# Patient Record
Sex: Male | Born: 1980
Health system: Southern US, Community
[De-identification: ages and names within clinical notes are randomized; demographics above are authoritative.]

## PROBLEM LIST (undated history)

## (undated) DIAGNOSIS — L02429 Furuncle of limb, unspecified: Secondary | ICD-10-CM

## (undated) DIAGNOSIS — M549 Dorsalgia, unspecified: Secondary | ICD-10-CM

## (undated) DIAGNOSIS — J309 Allergic rhinitis, unspecified: Secondary | ICD-10-CM

## (undated) DIAGNOSIS — L739 Follicular disorder, unspecified: Secondary | ICD-10-CM

## (undated) DIAGNOSIS — E669 Obesity, unspecified: Secondary | ICD-10-CM

## (undated) DIAGNOSIS — G4733 Obstructive sleep apnea (adult) (pediatric): Secondary | ICD-10-CM

## (undated) DIAGNOSIS — Z9989 Dependence on other enabling machines and devices: Principal | ICD-10-CM

## (undated) HISTORY — DX: Allergic rhinitis, unspecified: J30.9

## (undated) HISTORY — DX: Follicular disorder, unspecified: L73.9

## (undated) HISTORY — DX: Dorsalgia, unspecified: M54.9

## (undated) HISTORY — DX: Dependence on other enabling machines and devices: Z99.89

## (undated) HISTORY — PX: NASAL SEPTUM SURGERY: SHX37

## (undated) HISTORY — DX: Obesity, unspecified: E66.9

## (undated) HISTORY — DX: Furuncle of limb, unspecified: L02.429

## (undated) HISTORY — DX: Obstructive sleep apnea (adult) (pediatric): G47.33

---

## 2003-04-28 ENCOUNTER — Ambulatory Visit (HOSPITAL_BASED_OUTPATIENT_CLINIC_OR_DEPARTMENT_OTHER): Admission: RE | Admit: 2003-04-28 | Discharge: 2003-04-28 | Payer: Self-pay | Admitting: Otolaryngology

## 2013-01-08 ENCOUNTER — Encounter: Payer: Self-pay | Admitting: *Deleted

## 2013-01-08 ENCOUNTER — Encounter: Payer: Self-pay | Admitting: Cardiology

## 2013-01-10 ENCOUNTER — Encounter: Payer: Self-pay | Admitting: Cardiology

## 2013-01-10 ENCOUNTER — Ambulatory Visit (INDEPENDENT_AMBULATORY_CARE_PROVIDER_SITE_OTHER): Payer: 59 | Admitting: Cardiology

## 2013-01-10 VITALS — BP 114/70 | HR 72 | Ht 70.0 in | Wt 260.4 lb

## 2013-01-10 DIAGNOSIS — E669 Obesity, unspecified: Secondary | ICD-10-CM | POA: Insufficient documentation

## 2013-01-10 DIAGNOSIS — G4733 Obstructive sleep apnea (adult) (pediatric): Secondary | ICD-10-CM

## 2013-01-10 NOTE — Patient Instructions (Signed)
We [facility] to [facility] your [facility]  Your physician wants you to follow-up in: 6 Months with Dr. Sherlyn Lick will receive a reminder letter in the mail two months in advance. If you don't receive a letter, please call our office to schedule the follow-up appointment.

## 2013-01-10 NOTE — Progress Notes (Signed)
[address] [place], [place]  [address] Phone: [phone] Fax:  [phone]  Date:  01/10/2013   ID:  Justin Hanson, DOB April 27, 1980, MRN [id number]  PCP:  No primary provider on file.  Sleep Medicine:  Armanda Magic, MD  History of Present Illness: Justin Hanson is a 32 y.o. male with a history of obesity who recently saw his PCP for possible sleep apnea.  He was complaining of loud snoring, witnessed apnea, daytime sleepiness and a PSG was ordered.  He was found to have severe OSA with an AHI of 63/hr and underwent CPAP titration to 11cm H2O.  He is tolerating his CPAP well.  He tolerates the mask and pressure well.  He uses a full face mask which he tolerates well.  He feels rested when he gets up and no longer snores at night.  He says that sometimes he forgets to put the mask back on if he gets up to go to the bathroom.  He has had an improvement in his daytime sleepiness.  He does not get any aerobic exercise.  He is sleeping a lot more on his back.   Wt Readings from Last 3 Encounters:  01/10/13 260 lb 6.4 oz (118.117 kg)     Past Medical History  Diagnosis Date  . Allergic rhinitis   . Back pain   . Carbuncle and furuncle of leg, except foot   . Folliculitis     thighs with abscess of the posterior scalp  . OSA on CPAP     severe with AHI 63.66/hr now on CPAP at 11cm H2O  . Obesity (BMI 30-39.9)     Current Outpatient Prescriptions  Medication Sig Dispense Refill  . clomiPHENE (CLOMID) 50 MG tablet Take 50 mg by mouth daily.      Marland Kitchen doxycycline (VIBRAMYCIN) 100 MG capsule Take 100 mg by mouth daily.        No current facility-administered medications for this visit.    Allergies:   No Known Allergies  Social History:  The patient  reports that he has been smoking.  He does not have any smokeless tobacco history on file. He reports that he drinks alcohol.   Family History:  The patient's family history includes Diabetes in his father.   ROS:  Please  see the history of present illness.      All other systems reviewed and negative.   PHYSICAL EXAM: VS:  BP 114/70  Pulse 72  Ht 5\' 10"  (1.778 m)  Wt 260 lb 6.4 oz (118.117 kg)  BMI 37.36 kg/m2 Well nourished, well developed, in no acute distress HEENT: normal Neck: no JVD Cardiac:  normal S1, S2; RRR; no murmur Lungs:  clear to auscultation bilaterally, no wheezing, rhonchi or rales Abd: soft, nontender, no hepatomegaly Ext: no edema Skin: warm and dry Neuro:  CNs 2-12 intact, no focal abnormalities noted       ASSESSMENT AND PLAN:  1. Severe OSA on CPAP therapy with improvement in symptoms of daytime sleepiness and snoring  - His download after 2 week autotitration showed an AHI of 13.3/hr and 67% compliance in using more than 4 hours nightly and 95th % pressure 9.7 and max pressure 10.9cm H2O with an AHI of 13.3.Marland Kitchen    - I have encouraged him to try to sleep on his side and avoid sleeping supine.  - I will set his pressure at 11cm H2O and repeat d/l in 4 weeks with him avoiding supine  sleep. 2. Obesity  - I have encouraged him to get into an exercise program and to work on his diet with low carbs and watch his portions.  Followup with me in 6 months  Signed, Armanda Magic, MD 01/10/2013 4:06 PM

## 2013-02-05 ENCOUNTER — Encounter: Payer: Self-pay | Admitting: Cardiology

## 2013-07-02 ENCOUNTER — Encounter: Payer: Self-pay | Admitting: Cardiology

## 2017-05-16 ENCOUNTER — Other Ambulatory Visit: Payer: Self-pay

## 2017-05-16 ENCOUNTER — Encounter (HOSPITAL_COMMUNITY): Payer: Self-pay | Admitting: Emergency Medicine

## 2017-05-16 ENCOUNTER — Observation Stay (HOSPITAL_COMMUNITY)
Admission: EM | Admit: 2017-05-16 | Discharge: 2017-05-18 | Disposition: A | Discharge status: Home / Self Care | Payer: BLUE CROSS/BLUE SHIELD | Attending: Surgery | Admitting: Surgery

## 2017-05-16 DIAGNOSIS — E669 Obesity, unspecified: Secondary | ICD-10-CM | POA: Diagnosis not present

## 2017-05-16 DIAGNOSIS — K610 Anal abscess: Secondary | ICD-10-CM | POA: Insufficient documentation

## 2017-05-16 DIAGNOSIS — K611 Rectal abscess: Principal | ICD-10-CM | POA: Diagnosis present

## 2017-05-16 DIAGNOSIS — F17200 Nicotine dependence, unspecified, uncomplicated: Secondary | ICD-10-CM | POA: Insufficient documentation

## 2017-05-16 DIAGNOSIS — G4733 Obstructive sleep apnea (adult) (pediatric): Secondary | ICD-10-CM | POA: Diagnosis not present

## 2017-05-16 DIAGNOSIS — Z6837 Body mass index (BMI) 37.0-37.9, adult: Secondary | ICD-10-CM | POA: Insufficient documentation

## 2017-05-16 NOTE — ED Triage Notes (Signed)
Pt to ED with c/o rectal abscess x's 4 days.  Pt st's he went to a Urgent Care and was told to come here.  Pt denies any drainage

## 2017-05-17 ENCOUNTER — Inpatient Hospital Stay (HOSPITAL_COMMUNITY): Payer: BLUE CROSS/BLUE SHIELD | Admitting: Certified Registered Nurse Anesthetist

## 2017-05-17 ENCOUNTER — Encounter (HOSPITAL_COMMUNITY)
Admission: EM | Disposition: A | Discharge status: Home / Self Care | Payer: Self-pay | Source: Home / Self Care | Attending: Emergency Medicine

## 2017-05-17 ENCOUNTER — Encounter (HOSPITAL_COMMUNITY): Payer: Self-pay | Admitting: Radiology

## 2017-05-17 ENCOUNTER — Emergency Department (HOSPITAL_COMMUNITY): Payer: BLUE CROSS/BLUE SHIELD

## 2017-05-17 ENCOUNTER — Other Ambulatory Visit: Payer: Self-pay

## 2017-05-17 DIAGNOSIS — K611 Rectal abscess: Secondary | ICD-10-CM | POA: Diagnosis present

## 2017-05-17 HISTORY — PX: INCISION AND DRAINAGE ABSCESS: SHX5864

## 2017-05-17 HISTORY — PX: INCISE AND DRAIN ABCESS: PRO64

## 2017-05-17 LAB — CBC WITH DIFFERENTIAL/PLATELET
BASOS PCT: 0 %
Basophils Absolute: 0 10*3/uL (ref 0.0–0.1)
EOS PCT: 1 %
Eosinophils Absolute: 0.2 10*3/uL (ref 0.0–0.7)
HEMATOCRIT: 41.8 % (ref 39.0–52.0)
HEMOGLOBIN: 14.4 g/dL (ref 13.0–17.0)
LYMPHS PCT: 18 %
Lymphs Abs: 2.8 10*3/uL (ref 0.7–4.0)
MCH: 32.4 pg (ref 26.0–34.0)
MCHC: 34.4 g/dL (ref 30.0–36.0)
MCV: 93.9 fL (ref 78.0–100.0)
MONOS PCT: 10 %
Monocytes Absolute: 1.5 10*3/uL — ABNORMAL HIGH (ref 0.1–1.0)
NEUTROS PCT: 71 %
Neutro Abs: 10.9 10*3/uL — ABNORMAL HIGH (ref 1.7–7.7)
Platelets: 199 10*3/uL (ref 150–400)
RBC: 4.45 MIL/uL (ref 4.22–5.81)
RDW: 13.9 % (ref 11.5–15.5)
WBC: 15.4 10*3/uL — AB (ref 4.0–10.5)

## 2017-05-17 LAB — CBC
HEMATOCRIT: 40.8 % (ref 39.0–52.0)
HEMOGLOBIN: 13.9 g/dL (ref 13.0–17.0)
MCH: 32.6 pg (ref 26.0–34.0)
MCHC: 34.1 g/dL (ref 30.0–36.0)
MCV: 95.6 fL (ref 78.0–100.0)
Platelets: 182 10*3/uL (ref 150–400)
RBC: 4.27 MIL/uL (ref 4.22–5.81)
RDW: 14 % (ref 11.5–15.5)
WBC: 13.8 10*3/uL — ABNORMAL HIGH (ref 4.0–10.5)

## 2017-05-17 LAB — CREATININE, SERUM
Creatinine, Ser: 0.69 mg/dL (ref 0.61–1.24)
GFR calc Af Amer: >60 mL/min (ref >60)
GFR calc non Af Amer: >60 mL/min (ref >60)

## 2017-05-17 LAB — BASIC METABOLIC PANEL
ANION GAP: 11 (ref 5–15)
BUN: 8 mg/dL (ref 6–20)
CHLORIDE: 101 mmol/L (ref 101–111)
CO2: 23 mmol/L (ref 22–32)
Calcium: 9.3 mg/dL (ref 8.9–10.3)
Creatinine, Ser: 0.69 mg/dL (ref 0.61–1.24)
GFR calc Af Amer: >60 mL/min (ref >60)
GLUCOSE: 124 mg/dL — AB (ref 65–99)
POTASSIUM: 4 mmol/L (ref 3.5–5.1)
Sodium: 135 mmol/L (ref 135–145)

## 2017-05-17 LAB — HIV ANTIBODY (ROUTINE TESTING W REFLEX): HIV Screen 4th Generation wRfx: NONREACTIVE

## 2017-05-17 SURGERY — INCISION AND DRAINAGE, ABSCESS
Anesthesia: General | Site: Rectum

## 2017-05-17 MED ORDER — ENOXAPARIN SODIUM 40 MG/0.4ML ~~LOC~~ SOLN
40.0000 mg | SUBCUTANEOUS | Status: DC
  Filled 2017-05-17: qty 0.4

## 2017-05-17 MED ORDER — HYDRALAZINE HCL 20 MG/ML IJ SOLN: 10.0000 mg | INTRAMUSCULAR | Status: DC | PRN

## 2017-05-17 MED ORDER — IOPAMIDOL (ISOVUE-300) INJECTION 61%
INTRAVENOUS | Status: AC
  Administered 2017-05-17: 100 mL
  Filled 2017-05-17: qty 100

## 2017-05-17 MED ORDER — SUGAMMADEX SODIUM 500 MG/5ML IV SOLN
INTRAVENOUS | Status: DC | PRN
  Administered 2017-05-17: 200 mg via INTRAVENOUS

## 2017-05-17 MED ORDER — KETOROLAC TROMETHAMINE 15 MG/ML IJ SOLN: 15.0000 mg | Freq: Four times a day (QID) | INTRAMUSCULAR | Status: DC | PRN

## 2017-05-17 MED ORDER — ONDANSETRON 4 MG PO TBDP: 4.0000 mg | ORAL_TABLET | Freq: Four times a day (QID) | ORAL | Status: DC | PRN

## 2017-05-17 MED ORDER — FENTANYL CITRATE (PF) 100 MCG/2ML IJ SOLN
INTRAMUSCULAR | Status: DC | PRN
  Administered 2017-05-17: 100 ug via INTRAVENOUS

## 2017-05-17 MED ORDER — ROCURONIUM BROMIDE 100 MG/10ML IV SOLN
INTRAVENOUS | Status: DC | PRN
  Administered 2017-05-17: 50 mg via INTRAVENOUS

## 2017-05-17 MED ORDER — BUPIVACAINE-EPINEPHRINE (PF) 0.5% -1:200000 IJ SOLN
INTRAMUSCULAR | Status: AC
  Filled 2017-05-17: qty 30

## 2017-05-17 MED ORDER — ACETAMINOPHEN 325 MG PO TABS
650.0000 mg | ORAL_TABLET | Freq: Four times a day (QID) | ORAL | Status: DC | PRN
  Administered 2017-05-17 (×2): 650 mg via ORAL
  Filled 2017-05-17 (×2): qty 2

## 2017-05-17 MED ORDER — ONDANSETRON HCL 4 MG/2ML IJ SOLN: 4.0000 mg | Freq: Four times a day (QID) | INTRAMUSCULAR | Status: DC | PRN

## 2017-05-17 MED ORDER — ACETAMINOPHEN 650 MG RE SUPP: 650.0000 mg | Freq: Four times a day (QID) | RECTAL | Status: DC | PRN

## 2017-05-17 MED ORDER — FENTANYL CITRATE (PF) 250 MCG/5ML IJ SOLN
INTRAMUSCULAR | Status: AC
  Filled 2017-05-17: qty 5

## 2017-05-17 MED ORDER — MEPERIDINE HCL 50 MG/ML IJ SOLN: 6.2500 mg | INTRAMUSCULAR | Status: DC | PRN

## 2017-05-17 MED ORDER — OXYCODONE HCL 5 MG PO TABS: 5.0000 mg | ORAL_TABLET | ORAL | Status: DC | PRN

## 2017-05-17 MED ORDER — LIDOCAINE HCL (CARDIAC) 20 MG/ML IV SOLN
INTRAVENOUS | Status: DC | PRN
  Administered 2017-05-17: 60 mg via INTRAVENOUS

## 2017-05-17 MED ORDER — ACETAMINOPHEN 10 MG/ML IV SOLN
INTRAVENOUS | Status: DC | PRN
  Administered 2017-05-17: 1000 mg via INTRAVENOUS

## 2017-05-17 MED ORDER — SULFAMETHOXAZOLE-TRIMETHOPRIM 800-160 MG PO TABS
2.0000 | ORAL_TABLET | Freq: Two times a day (BID) | ORAL | Status: DC
  Administered 2017-05-17 (×2): 2 via ORAL
  Filled 2017-05-17: qty 2

## 2017-05-17 MED ORDER — DIPHENHYDRAMINE HCL 50 MG/ML IJ SOLN: 25.0000 mg | Freq: Four times a day (QID) | INTRAMUSCULAR | Status: DC | PRN

## 2017-05-17 MED ORDER — HYDROMORPHONE HCL 1 MG/ML IJ SOLN: 0.2500 mg | INTRAMUSCULAR | Status: DC | PRN

## 2017-05-17 MED ORDER — LACTATED RINGERS IV SOLN
INTRAVENOUS | Status: DC | PRN
  Administered 2017-05-17: 13:00:00 via INTRAVENOUS

## 2017-05-17 MED ORDER — PROMETHAZINE HCL 25 MG/ML IJ SOLN: 6.2500 mg | INTRAMUSCULAR | Status: DC | PRN

## 2017-05-17 MED ORDER — KCL IN DEXTROSE-NACL 20-5-0.45 MEQ/L-%-% IV SOLN
INTRAVENOUS | Status: DC
  Administered 2017-05-17 (×2): via INTRAVENOUS
  Filled 2017-05-17 (×2): qty 1000

## 2017-05-17 MED ORDER — BUPIVACAINE-EPINEPHRINE (PF) 0.5% -1:200000 IJ SOLN
INTRAMUSCULAR | Status: DC | PRN
  Administered 2017-05-17: 10 mL via PERINEURAL

## 2017-05-17 MED ORDER — DEXAMETHASONE SODIUM PHOSPHATE 10 MG/ML IJ SOLN
INTRAMUSCULAR | Status: DC | PRN
  Administered 2017-05-17: 10 mg via INTRAVENOUS

## 2017-05-17 MED ORDER — MIDAZOLAM HCL 5 MG/5ML IJ SOLN
INTRAMUSCULAR | Status: DC | PRN
  Administered 2017-05-17: 2 mg via INTRAVENOUS

## 2017-05-17 MED ORDER — DIPHENHYDRAMINE HCL 25 MG PO CAPS: 25.0000 mg | ORAL_CAPSULE | Freq: Four times a day (QID) | ORAL | Status: DC | PRN

## 2017-05-17 MED ORDER — CEFAZOLIN SODIUM-DEXTROSE 2-3 GM-%(50ML) IV SOLR
INTRAVENOUS | Status: DC | PRN
  Administered 2017-05-17: 2 g via INTRAVENOUS

## 2017-05-17 MED ORDER — MORPHINE SULFATE (PF) 4 MG/ML IV SOLN: 2.0000 mg | INTRAVENOUS | Status: DC | PRN

## 2017-05-17 MED ORDER — PROPOFOL 10 MG/ML IV BOLUS
INTRAVENOUS | Status: AC
  Filled 2017-05-17: qty 20

## 2017-05-17 MED ORDER — KETOROLAC TROMETHAMINE 30 MG/ML IJ SOLN
30.0000 mg | Freq: Once | INTRAMUSCULAR | Status: AC
  Administered 2017-05-17: 30 mg via INTRAVENOUS
  Filled 2017-05-17: qty 1

## 2017-05-17 MED ORDER — ONDANSETRON HCL 4 MG/2ML IJ SOLN
4.0000 mg | Freq: Once | INTRAMUSCULAR | Status: AC
  Administered 2017-05-17: 4 mg via INTRAVENOUS
  Filled 2017-05-17: qty 2

## 2017-05-17 MED ORDER — ACETAMINOPHEN 10 MG/ML IV SOLN
INTRAVENOUS | Status: AC
  Filled 2017-05-17: qty 100

## 2017-05-17 MED ORDER — MIDAZOLAM HCL 2 MG/2ML IJ SOLN
INTRAMUSCULAR | Status: AC
  Filled 2017-05-17: qty 2

## 2017-05-17 MED ORDER — ONDANSETRON HCL 4 MG/2ML IJ SOLN
INTRAMUSCULAR | Status: DC | PRN
  Administered 2017-05-17: 4 mg via INTRAVENOUS

## 2017-05-17 MED ORDER — OXYCODONE HCL 5 MG/5ML PO SOLN: 5.0000 mg | Freq: Once | ORAL | Status: DC | PRN

## 2017-05-17 MED ORDER — 0.9 % SODIUM CHLORIDE (POUR BTL) OPTIME
TOPICAL | Status: DC | PRN
  Administered 2017-05-17: 500 mL

## 2017-05-17 MED ORDER — MORPHINE SULFATE (PF) 4 MG/ML IV SOLN
4.0000 mg | Freq: Once | INTRAVENOUS | Status: DC
  Filled 2017-05-17: qty 1

## 2017-05-17 MED ORDER — KETOROLAC TROMETHAMINE 30 MG/ML IJ SOLN
INTRAMUSCULAR | Status: DC | PRN
  Administered 2017-05-17: 30 mg via INTRAVENOUS

## 2017-05-17 MED ORDER — PROPOFOL 10 MG/ML IV BOLUS
INTRAVENOUS | Status: DC | PRN
  Administered 2017-05-17: 40 mg via INTRAVENOUS
  Administered 2017-05-17: 160 mg via INTRAVENOUS

## 2017-05-17 MED ORDER — LACTATED RINGERS IV SOLN
INTRAVENOUS | Status: DC
  Administered 2017-05-17: 12:00:00 via INTRAVENOUS

## 2017-05-17 MED ORDER — OXYCODONE HCL 5 MG PO TABS: 5.0000 mg | ORAL_TABLET | Freq: Once | ORAL | Status: DC | PRN

## 2017-05-17 SURGICAL SUPPLY — 35 items
BLADE CLIPPER SURG (BLADE) IMPLANT
BNDG GAUZE ELAST 4 BULKY (GAUZE/BANDAGES/DRESSINGS) IMPLANT
CANISTER SUCT 3000ML PPV (MISCELLANEOUS) ×3 IMPLANT
COVER SURGICAL LIGHT HANDLE (MISCELLANEOUS) ×3 IMPLANT
DRAPE LAPAROSCOPIC ABDOMINAL (DRAPES) IMPLANT
DRAPE LAPAROTOMY 100X72 PEDS (DRAPES) ×3 IMPLANT
DRAPE UTILITY 15X26 W/TAPE STR (DRAPE) ×3 IMPLANT
DRESSING ADAPTIC 1/2  N-ADH (PACKING) ×3 IMPLANT
DRSG PAD ABDOMINAL 8X10 ST (GAUZE/BANDAGES/DRESSINGS) ×3 IMPLANT
ELECT CAUTERY BLADE 6.4 (BLADE) ×3 IMPLANT
ELECT REM PT RETURN 9FT ADLT (ELECTROSURGICAL) ×3
ELECTRODE REM PT RTRN 9FT ADLT (ELECTROSURGICAL) ×1 IMPLANT
GAUZE SPONGE 4X4 12PLY STRL (GAUZE/BANDAGES/DRESSINGS) ×3 IMPLANT
GLOVE BIO SURGEON STRL SZ 6 (GLOVE) ×3 IMPLANT
GLOVE BIOGEL PI IND STRL 6.5 (GLOVE) ×1 IMPLANT
GLOVE BIOGEL PI INDICATOR 6.5 (GLOVE) ×2
GOWN STRL REUS W/ TWL LRG LVL3 (GOWN DISPOSABLE) ×2 IMPLANT
GOWN STRL REUS W/TWL LRG LVL3 (GOWN DISPOSABLE) ×4
KIT BASIN OR (CUSTOM PROCEDURE TRAY) ×3 IMPLANT
KIT ROOM TURNOVER OR (KITS) ×3 IMPLANT
NEEDLE 18GX1X1/2 (RX/OR ONLY) (NEEDLE) ×3 IMPLANT
NEEDLE HYPO 25GX1X1/2 BEV (NEEDLE) ×3 IMPLANT
NS IRRIG 1000ML POUR BTL (IV SOLUTION) ×3 IMPLANT
PACK SURGICAL SETUP 50X90 (CUSTOM PROCEDURE TRAY) ×3 IMPLANT
PAD ARMBOARD 7.5X6 YLW CONV (MISCELLANEOUS) ×3 IMPLANT
PENCIL BUTTON HOLSTER BLD 10FT (ELECTRODE) ×3 IMPLANT
SWAB COLLECTION DEVICE MRSA (MISCELLANEOUS) ×3 IMPLANT
SWAB CULTURE ESWAB REG 1ML (MISCELLANEOUS) ×3 IMPLANT
SYR BULB 3OZ (MISCELLANEOUS) ×3 IMPLANT
SYR CONTROL 10ML LL (SYRINGE) ×6 IMPLANT
TOWEL OR 17X24 6PK STRL BLUE (TOWEL DISPOSABLE) ×3 IMPLANT
TOWEL OR 17X26 10 PK STRL BLUE (TOWEL DISPOSABLE) ×3 IMPLANT
TUBE CONNECTING 12'X1/4 (SUCTIONS) ×1
TUBE CONNECTING 12X1/4 (SUCTIONS) ×2 IMPLANT
YANKAUER SUCT BULB TIP NO VENT (SUCTIONS) ×3 IMPLANT

## 2017-05-17 NOTE — Progress Notes (Signed)
Patient refused CPAP at this time. Wears occasionally at home. Encouraged patient to let RT know if CPAP desired during hospital stay.

## 2017-05-17 NOTE — ED Notes (Signed)
Pt sleeping at this time.  Wife at bedside.  

## 2017-05-17 NOTE — Discharge Instructions (Signed)
Disposable Sitz Bath °A disposable sitz bath is a plastic basin that fits over the toilet. A bag is hung above the toilet, and the bag is connected to a tube that opens into the basin. The bag is filled with warm water that flows into the basin through the tube. A sitz bath can be used to help relieve symptoms, clean, and promote healing in the genital and anal areas, as well as in the lower abdomen and buttocks. °What are the risks? °Sitz baths are generally very safe. It is possible for the skin between the genitals and the anus (perineum) to become infected, but this is rare. You can avoid this by cleaning your sitz bath supplies thoroughly. °How to use a disposable sitz bath °1. Close the clamp on the tube. Make sure the clamp is closed tightly to prevent leakage. °2. Fill the sitz bath basin and the plastic bag with warm water. The water should be warm enough to be comfortable, but not hot. °3. Raise the toilet seat and place the filled basin on the toilet. Make sure the overflow opening is facing toward the back of the toilet. °? If you prefer, you may place the empty basin on the toilet first, and then use the plastic bag to fill the basin with warm water. °4. Hang the filled plastic bag overhead on a hook or towel rack close to the toilet. The bag should be higher than the toilet so that the water will flow down through the tube. °5. Attach the tube to the opening on the basin. Make sure that the tube is attached to the basin tightly to prevent leakage. °6. Sit on the basin and release the clamp. This will allow warm water to flow into the basin and flush the area around your genitals and anus. °7. Remain sitting on the basin for about 15-20 minutes, or as long as told by your health care provider. °8. Stand up and gently pat your skin dry. If directed, apply clean bandages (dressings) to the affected area as told by your health care provider. °9. Carefully remove the basin from the toilet seat and tip the  basin into the toilet to empty any remaining water. Empty any remaining water from the plastic bag into the toilet. Then, flush the toilet. °10. Wash the basin with warm water and soap. Let the basin air dry in the sink. You should also let the plastic bag and the tubing air dry. °11. Store the basin, tubing, and plastic bag in a clean, dry area. °12. Wash your hands with soap and water. If soap and water are not available, use hand sanitizer. °Contact a health care provider if: °· You have symptoms that get worse instead of better. °· You develop new skin irritation, redness, or swelling around your genitals or anus. °This information is not intended to replace advice given to you by your health care provider. Make sure you discuss any questions you have with your health care provider. °Document Released: 09/06/2011 Document Revised: 08/13/2015 Document Reviewed: 01/25/2015 °Elsevier Interactive Patient Education © 2018 [employer] ° °

## 2017-05-17 NOTE — H&P (Signed)
Justin Hanson is an 37 y.o. male.   Chief Complaint: perirectal abscess HPI: 37 yo male with 5 day history of pain and swelling at his anal area. It has gotten more painful each day. He denies drainage. He denies fevers. He had a pilonidal cyst drained 10 years ago.  Past Medical History:  Diagnosis Date  . Allergic rhinitis   . Back pain   . Carbuncle and furuncle of leg, except foot   . Folliculitis    thighs with abscess of the posterior scalp  . Obesity (BMI 30-39.9)   . OSA on CPAP    severe with AHI 63.66/hr now on CPAP at 11cm H2O    Past Surgical History:  Procedure Laterality Date  . NASAL SEPTUM SURGERY      Family History  Problem Relation Age of Onset  . Diabetes Father    Social History:  reports that he has been smoking.  he has never used smokeless tobacco. He reports that he drinks alcohol. He reports that he uses drugs. Drug: Marijuana.  Allergies: No Known Allergies   (Not in a hospital admission)  Results for orders placed or performed during the hospital encounter of 05/16/17 (from the past 48 hour(s))  Basic metabolic panel     Status: Abnormal   Collection Time: 05/17/17 12:13 AM  Result Value Ref Range   Sodium 135 135 - 145 mmol/L   Potassium 4.0 3.5 - 5.1 mmol/L   Chloride 101 101 - 111 mmol/L   CO2 23 22 - 32 mmol/L   Glucose, Bld 124 (H) 65 - 99 mg/dL   BUN 8 6 - 20 mg/dL   Creatinine, Ser 0.69 0.61 - 1.24 mg/dL   Calcium 9.3 8.9 - 10.3 mg/dL   GFR calc non Af Amer >60 >60 mL/min   GFR calc Af Amer >60 >60 mL/min    Comment: (NOTE) The eGFR has been calculated using the CKD EPI equation. This calculation has not been validated in all clinical situations. eGFR's persistently <60 mL/min signify possible Chronic Kidney Disease.    Anion gap 11 5 - 15    Comment: Performed at [facility] [address] [place], [place] [address]   Ct Abdomen Pelvis W Contrast  Result Date: 05/17/2017 CLINICAL DATA:  37 year old male with  perirectal abscess. EXAM: CT ABDOMEN AND PELVIS WITH CONTRAST TECHNIQUE: Multidetector CT imaging of the abdomen and pelvis was performed using the standard protocol following bolus administration of intravenous contrast. CONTRAST:  <See Chart> ISOVUE-300 IOPAMIDOL (ISOVUE-300) INJECTION 61% COMPARISON:  None. FINDINGS: Lower chest: The visualized lung bases are clear. No intra-abdominal free air or free fluid. Hepatobiliary: No focal liver abnormality is seen. No gallstones, gallbladder wall thickening, or biliary dilatation. Pancreas: Unremarkable. No pancreatic ductal dilatation or surrounding inflammatory changes. Spleen: Normal in size without focal abnormality. Adrenals/Urinary Tract: Adrenal glands are unremarkable. Kidneys are normal, without renal calculi, focal lesion, or hydronephrosis. Bladder is unremarkable. Stomach/Bowel: There is a 3.0 x 3.0 x 2.3 cm low attenuating collection posterior to the anus inferior to the levator ani muscle and at the level of the puborectal muscle most consistent with an abscess. There is probably a fistulous track to the rectum (series 3 images 91-93). Evaluation however is limited in the absence of rectal contrast. The remainder of the bowel is unremarkable. There is no bowel obstruction or active inflammation. Normal appendix. Vascular/Lymphatic: No significant vascular findings are present. No enlarged abdominal or pelvic lymph nodes. Reproductive: The prostate and  seminal vesicles are grossly unremarkable. Other: None Musculoskeletal: No acute or significant osseous findings. IMPRESSION: Posterior perianal abscess with probable anal fistula. Clinical correlation is recommended. Electronically Signed   By: Anner Crete M.D.   On: 05/17/2017 01:31    Review of Systems  Constitutional: Negative for chills and fever.  HENT: Negative for hearing loss.   Eyes: Negative for blurred vision and double vision.  Respiratory: Negative for cough and hemoptysis.    Cardiovascular: Negative for chest pain and palpitations.  Gastrointestinal: Negative for abdominal pain, diarrhea, nausea and vomiting.  Genitourinary: Negative for dysuria and urgency.  Musculoskeletal: Negative for myalgias and neck pain.  Skin: Negative for itching and rash.  Neurological: Negative for dizziness, tingling and headaches.  Endo/Heme/Allergies: Does not bruise/bleed easily.  Psychiatric/Behavioral: Negative for depression and suicidal ideas.    Blood pressure 120/69, pulse 84, temperature 99.3 F (37.4 C), temperature source Oral, resp. rate 18, height 5' 10"  (1.778 m), weight 117.9 kg (260 lb), SpO2 96 %. Physical Exam  Vitals reviewed. Constitutional: He is oriented to person, place, and time. He appears well-developed and well-nourished.  HENT:  Head: Normocephalic and atraumatic.  Eyes: Conjunctivae and EOM are normal. Pupils are equal, round, and reactive to light.  Neck: Normal range of motion. Neck supple.  Cardiovascular: Normal rate and regular rhythm.  Respiratory: Effort normal and breath sounds normal.  GI: Soft. Bowel sounds are normal. He exhibits no distension. There is no tenderness.  Genitourinary:  Genitourinary Comments: Posterior pain and fullness, no drainage  Musculoskeletal: Normal range of motion.  Neurological: He is alert and oriented to person, place, and time.  Skin: Skin is warm and dry.  Psychiatric: He has a normal mood and affect. His behavior is normal.     Assessment/Plan 37 yo male with perirectal abscess -IV abx -admit to hospital -plan for OR in am -discussed the CT findings concerning for fistula and plan to evaluate for internal opening at time of surgery  Mickeal Skinner, MD 05/17/2017, 2:56 AM

## 2017-05-17 NOTE — ED Notes (Signed)
Called lab ref. CBC results lab reports machine is down and will be at least 30 - 45 mins.before CBC results

## 2017-05-17 NOTE — ED Notes (Signed)
Pt transported to OR. Consent form given to transport. Kendal HymenBonnie, RN notified that consent form is with pt however still requires provider signature.

## 2017-05-17 NOTE — Anesthesia Preprocedure Evaluation (Signed)
Anesthesia Evaluation  Patient identified by MRN, date of birth, ID band Patient awake    Reviewed: Allergy & Precautions, NPO status , Patient's Chart, lab work & pertinent test results  Airway Mallampati: II  TM Distance: >3 FB Neck ROM: Full    Dental no notable dental hx.    Pulmonary sleep apnea , Current Smoker,    Pulmonary exam normal breath sounds clear to auscultation       Cardiovascular negative cardio ROS Normal cardiovascular exam Rhythm:Regular Rate:Normal     Neuro/Psych negative neurological ROS  negative psych ROS   GI/Hepatic negative GI ROS, Neg liver ROS,   Endo/Other  negative endocrine ROS  Renal/GU negative Renal ROS     Musculoskeletal negative musculoskeletal ROS (+)   Abdominal   Peds  Hematology negative hematology ROS (+)   Anesthesia Other Findings   Reproductive/Obstetrics negative OB ROS                             Anesthesia Physical Anesthesia Plan  ASA: II  Anesthesia Plan: General   Post-op Pain Management:    Induction: Intravenous  PONV Risk Score and Plan: Ondansetron and Dexamethasone  Airway Management Planned: Oral ETT  Additional Equipment:   Intra-op Plan:   Post-operative Plan: Extubation in OR  Informed Consent: I have reviewed the patients History and Physical, chart, labs and discussed the procedure including the risks, benefits and alternatives for the proposed anesthesia with the patient or authorized representative who has indicated his/her understanding and acceptance.   Dental advisory given  Plan Discussed with: CRNA  Anesthesia Plan Comments:         Anesthesia Quick Evaluation

## 2017-05-17 NOTE — ED Notes (Signed)
Pt st's abscess at rectum started 3 days ago.  Pt st's unable to sit, Pt denies any drainage.

## 2017-05-17 NOTE — ED Provider Notes (Signed)
Assumed care from PA Layden at shift change.  See prior notes for full H&P.  Briefly, 37 y.o. M here with 3 days of rectal pain, worse along left side.  Fever earlier today at urgent care, sent here for further evaluation.  On rectal exam, noted to have exquisite tenderness and some fluctuance internally.  Concern for abscess +/- fistula.  Plan:  CT AP pending.  Results for orders placed or performed during the hospital encounter of 05/16/17  Basic metabolic panel  Result Value Ref Range   Sodium 135 135 - 145 mmol/L   Potassium 4.0 3.5 - 5.1 mmol/L   Chloride 101 101 - 111 mmol/L   CO2 23 22 - 32 mmol/L   Glucose, Bld 124 (H) 65 - 99 mg/dL   BUN 8 6 - 20 mg/dL   Creatinine, Ser 1.610.69 0.61 - 1.24 mg/dL   Calcium 9.3 8.9 - 09.610.3 mg/dL   GFR calc non Af Amer >60 >60 mL/min   GFR calc Af Amer >60 >60 mL/min   Anion gap 11 5 - 15   Ct Abdomen Pelvis W Contrast  Result Date: 05/17/2017 CLINICAL DATA:  37 year old male with perirectal abscess. EXAM: CT ABDOMEN AND PELVIS WITH CONTRAST TECHNIQUE: Multidetector CT imaging of the abdomen and pelvis was performed using the standard protocol following bolus administration of intravenous contrast. CONTRAST:  <See Chart> ISOVUE-300 IOPAMIDOL (ISOVUE-300) INJECTION 61% COMPARISON:  None. FINDINGS: Lower chest: The visualized lung bases are clear. No intra-abdominal free air or free fluid. Hepatobiliary: No focal liver abnormality is seen. No gallstones, gallbladder wall thickening, or biliary dilatation. Pancreas: Unremarkable. No pancreatic ductal dilatation or surrounding inflammatory changes. Spleen: Normal in size without focal abnormality. Adrenals/Urinary Tract: Adrenal glands are unremarkable. Kidneys are normal, without renal calculi, focal lesion, or hydronephrosis. Bladder is unremarkable. Stomach/Bowel: There is a 3.0 x 3.0 x 2.3 cm low attenuating collection posterior to the anus inferior to the levator ani muscle and at the level of the puborectal  muscle most consistent with an abscess. There is probably a fistulous track to the rectum (series 3 images 91-93). Evaluation however is limited in the absence of rectal contrast. The remainder of the bowel is unremarkable. There is no bowel obstruction or active inflammation. Normal appendix. Vascular/Lymphatic: No significant vascular findings are present. No enlarged abdominal or pelvic lymph nodes. Reproductive: The prostate and seminal vesicles are grossly unremarkable. Other: None Musculoskeletal: No acute or significant osseous findings. IMPRESSION: Posterior perianal abscess with probable anal fistula. Clinical correlation is recommended. Electronically Signed   By: Elgie CollardArash  Radparvar M.D.   On: 05/17/2017 01:31    CT with findings of peri-anal abscess with fistula.  This seems consistent with patient's symptoms.  He will not tolerate I&D and given presence of likely fistula, will consult general surgery.  Spoke with Dr. Sheliah HatchKinsinger-- he will evaluate in the ED.  Patient will be admitted to surgical service for IV abx and OR in the morning.   Garlon HatchetSanders, Lisa M, PA-C 05/17/17 0409    Gilda CreasePollina, Christopher J, MD 05/19/17 [phone]

## 2017-05-17 NOTE — ED Provider Notes (Signed)
[facility] EMERGENCY DEPARTMENT Provider Note   CSN: [id number] Arrival date & time: 05/16/17  2030     History   Chief Complaint Chief Complaint  Patient presents with  . Abscess    HPI Justin Hanson is a 37 y.o. male for evaluation of 3 days of progressively worsening rectal pain.  Patient reports that he initially started having some mild pain to the left perirectal region.  Patient states that pain became worse over the course of the last 3 days.  He states that pain is worsened by trying to sit down or as any kind of movement.  Reports some subjective fever chills.  He states that today he went to urgent care for evaluation of the symptoms.  At urgent care, he was febrile at 102.1.  He states he did not take any NSAIDs prior to ED arrival.  Patient reports that he has had pain with bowel movements since onset of symptoms but denies any blood in the stools.  Patient denies any dysuria, hematuria, testicular pain or swelling, abdominal pain.  The history is provided by the patient.    Past Medical History:  Diagnosis Date  . Allergic rhinitis   . Back pain   . Carbuncle and furuncle of leg, except foot   . Folliculitis    thighs with abscess of the posterior scalp  . Obesity (BMI 30-39.9)   . OSA on CPAP    severe with AHI 63.66/hr now on CPAP at 11cm H2O    Patient Active Problem List   Diagnosis Date Noted  . Obesity (BMI 30-39.9)   . OSA on CPAP     Past Surgical History:  Procedure Laterality Date  . NASAL SEPTUM SURGERY         Home Medications    Prior to Admission medications   Medication Sig Start Date End Date Taking? Authorizing Provider  clomiPHENE (CLOMID) 50 MG tablet Take 50 mg by mouth daily.    [provider]  doxycycline (VIBRAMYCIN) 100 MG capsule Take 100 mg by mouth daily.     [provider]    Family History Family History  Problem Relation Age of Onset  . Diabetes Father     Social  History Social History   Tobacco Use  . Smoking status: Current Every Day Smoker  . Smokeless tobacco: Never Used  . Tobacco comment: electronic cigarettes  Substance Use Topics  . Alcohol use: Yes  . Drug use: Yes    Types: Marijuana     Allergies   Patient has no known allergies.   Review of Systems Review of Systems  Constitutional: Positive for fever.  Respiratory: Negative for cough and shortness of breath.   Cardiovascular: Negative for chest pain.  Gastrointestinal: Negative for abdominal pain, blood in stool, nausea and vomiting.  Genitourinary: Negative for dysuria, hematuria, penile pain, penile swelling, scrotal swelling and testicular pain.       Rectal Pain  All other systems reviewed and are negative.    Physical Exam Updated Vital Signs BP (!) 149/86 (BP Location: Right Arm)   Pulse (!) 101   Temp 98.6 F (37 C) (Oral)   Resp 18   Ht 5\' 10"  (1.778 m)   Wt 117.9 kg (260 lb)   SpO2 98%   BMI 37.31 kg/m   Physical Exam  Constitutional: He is oriented to person, place, and time. He appears well-developed and well-nourished.  HENT:  Head: Normocephalic and atraumatic.  Mouth/Throat: Oropharynx  is clear and moist and mucous membranes are normal.  Eyes: Conjunctivae, EOM and lids are normal. Pupils are equal, round, and reactive to light.  Neck: Full passive range of motion without pain.  Cardiovascular: Normal rate, regular rhythm, normal heart sounds and normal pulses. Exam reveals no gallop and no friction rub.  No murmur heard. Pulmonary/Chest: Effort normal and breath sounds normal.  No evidence of respiratory distress. Able to speak in full sentences without difficulty.  Abdominal: Soft. Normal appearance. There is no tenderness. There is no rigidity and no guarding.  Genitourinary: Testes normal and penis normal. Rectal exam shows tenderness. Right testis shows no swelling and no tenderness. Left testis shows no swelling and no tenderness.  Circumcised.  Genitourinary Comments: The exam was performed with a chaperone present.  Tenderness palpation to the left perianal region that extends up the gluteal area.  There is a small area noted to the left gluteus that appears to be a small sinus tract.  No active drainage.  No fluctuance noted.  Patient does have significant tenderness on digital rectal exam.  There is questionable fluctuance noted to the left upper region.  No mass noted.  No gross blood.  Musculoskeletal: Normal range of motion.  Neurological: He is alert and oriented to person, place, and time.  Skin: Skin is warm and dry. Capillary refill takes less than 2 seconds.  Psychiatric: He has a normal mood and affect. His speech is normal.  Nursing note and vitals reviewed.    ED Treatments / Results  Labs (all labs ordered are listed, but only abnormal results are displayed) Labs Reviewed  BASIC METABOLIC PANEL - Abnormal; Notable for the following components:      Result Value   Glucose, Bld 124 (*)    All other components within normal limits  CBC WITH DIFFERENTIAL/PLATELET    EKG  EKG Interpretation None       Radiology Ct Abdomen Pelvis W Contrast  Result Date: 05/17/2017 CLINICAL DATA:  37 year old male with perirectal abscess. EXAM: CT ABDOMEN AND PELVIS WITH CONTRAST TECHNIQUE: Multidetector CT imaging of the abdomen and pelvis was performed using the standard protocol following bolus administration of intravenous contrast. CONTRAST:  <See Chart> ISOVUE-300 IOPAMIDOL (ISOVUE-300) INJECTION 61% COMPARISON:  None. FINDINGS: Lower chest: The visualized lung bases are clear. No intra-abdominal free air or free fluid. Hepatobiliary: No focal liver abnormality is seen. No gallstones, gallbladder wall thickening, or biliary dilatation. Pancreas: Unremarkable. No pancreatic ductal dilatation or surrounding inflammatory changes. Spleen: Normal in size without focal abnormality. Adrenals/Urinary Tract: Adrenal glands  are unremarkable. Kidneys are normal, without renal calculi, focal lesion, or hydronephrosis. Bladder is unremarkable. Stomach/Bowel: There is a 3.0 x 3.0 x 2.3 cm low attenuating collection posterior to the anus inferior to the levator ani muscle and at the level of the puborectal muscle most consistent with an abscess. There is probably a fistulous track to the rectum (series 3 images 91-93). Evaluation however is limited in the absence of rectal contrast. The remainder of the bowel is unremarkable. There is no bowel obstruction or active inflammation. Normal appendix. Vascular/Lymphatic: No significant vascular findings are present. No enlarged abdominal or pelvic lymph nodes. Reproductive: The prostate and seminal vesicles are grossly unremarkable. Other: None Musculoskeletal: No acute or significant osseous findings. IMPRESSION: Posterior perianal abscess with probable anal fistula. Clinical correlation is recommended. Electronically Signed   By: Elgie Collard M.D.   On: 05/17/2017 01:31    Procedures Procedures (including critical care time)  Medications  Ordered in ED Medications  ketorolac (TORADOL) 30 MG/ML injection 30 mg (not administered)  iopamidol (ISOVUE-300) 61 % injection (100 mLs  Contrast Given 05/17/17 0059)  ondansetron (ZOFRAN) injection 4 mg (4 mg Intravenous Given 05/17/17 0048)     Initial Impression / Assessment and Plan / ED Course  I have reviewed the triage vital signs and the nursing notes.  Pertinent labs & imaging results that were available during my care of the patient were reviewed by me and considered in my medical decision making (see chart for details).     37 y.o. M who presents for evaluation of 3 days of rectal pain.  Patient reports that he has had difficulty sitting and moving secondary to pain.  Went to urgent care today for evaluation of symptoms and was febrile to 102.1.  No NSAID intervention prior to ED arrival. Patient is afebrile, non-toxic  appearing, appears uncomfortable.  Vital signs are stable.  On exam, patient does have an area of tenderness noted to the left gluteal region.  There appears to be a small sinus tract though there is no active drainage.  No mass or fluctuance noted.  No surrounding warmth or erythema.  On digital rectal exam, patient is significantly tender.  Normal GU exam.  Consider perirectal abscess versus perianal abscess.  History/physical exam is not concerning for Fournier's gangrene, epididymitis.  Given concerns for perirectal abscess, will plan for labs, CT.  Labs reviewed.  BMP is unremarkable.  Patient signed out to Sharilyn SitesLisa Sanders, PA-C with CT abd/pelvis pending.   Final Clinical Impressions(s) / ED Diagnoses   Final diagnoses:  Perianal abscess    ED Discharge Orders    None       Maxwell CaulLayden, Lindsey A, PA-C 05/17/17 0340    Glynn Octaveancour, Stephen, MD 05/17/17 [phone]

## 2017-05-17 NOTE — ED Notes (Signed)
Dr. Kingsinger at bedside. 

## 2017-05-17 NOTE — Progress Notes (Signed)
Patient arrived to 6n6, alert and oriented, no pain besides a slight headache. VSS, IV fluids infusing. Patient has a rectal incision with gauze/abd and mesh panties on. No family at bedside at moment, said he sent his wife to get food for him. Oriented patient to room and staff, will continue to monitor.

## 2017-05-17 NOTE — Transfer of Care (Signed)
Immediate Anesthesia Transfer of Care Note  Patient: Justin Hanson  Procedure(s) Performed: INCISION AND DRAINAGE perirectal ABSCESS (N/A Rectum)  Patient Location: PACU  Anesthesia Type:General  Level of Consciousness: awake, alert  and oriented  Airway & Oxygen Therapy: Patient Spontanous Breathing and Patient connected to nasal cannula oxygen  Post-op Assessment:   Post vital signs: Reviewed and stable  Last Vitals:  Vitals:   05/17/17 1130 05/17/17 1420  BP: (!) 110/51 128/80  Pulse: 74 80  Resp:  20  Temp:  36.7 C  SpO2: 96% 100%    Last Pain:  Vitals:   05/17/17 1420  TempSrc:   PainSc: (P) 0-No pain         Complications: No apparent anesthesia complications

## 2017-05-17 NOTE — Interval H&P Note (Signed)
History and Physical Interval Note:  05/17/2017 8:02 AM  Justin Hanson  has presented today for surgery, with the diagnosis of perirectal abscess  The various methods of treatment have been discussed with the patient and family. After consideration of risks, benefits and other options for treatment, the patient has consented to  Procedure(s): INCISION AND DRAINAGE perirectal ABSCESS (N/A) as a surgical intervention .  The patient's history has been reviewed, patient examined, no change in status, stable for surgery.  I have reviewed the patient's chart and labs.  Questions were answered to the patient's satisfaction.     Joson Sapp Lollie SailsA Carmilla Granville

## 2017-05-17 NOTE — Anesthesia Procedure Notes (Signed)
Procedure Name: Intubation Date/Time: 05/17/2017 1:58 PM Performed by: Gayland Curry, CRNA Pre-anesthesia Checklist: Patient identified, Suction available and Patient being monitored Patient Re-evaluated:Patient Re-evaluated prior to induction Oxygen Delivery Method: Circle system utilized Preoxygenation: Pre-oxygenation with 100% oxygen Induction Type: IV induction Ventilation: Mask ventilation without difficulty and Oral airway inserted - appropriate to patient size Laryngoscope Size: Mac and 4 Grade View: Grade I Tube type: Oral Tube size: 7.0 mm Number of attempts: 1 Placement Confirmation: ETT inserted through vocal cords under direct vision and positive ETCO2 Secured at: 22 cm Tube secured with: Tape Dental Injury: Teeth and Oropharynx as per pre-operative assessment

## 2017-05-17 NOTE — Op Note (Signed)
Operative Note  Justin Hanson  [id number]  [id number]  05/17/2017   Surgeon: Lady Deutscherhelsea A ConnorMD  Assistant: Or staff  Procedure performed: incision and drainage of perirectal abscess, rectal exam under anesthesia.  Preop diagnosis: perirectal abscess Post-op diagnosis/intraop findings: same  Specimens: cultures were sent of the abscess fluid Retained items: packing, to be removed in 24 hours EBL: 0cc Complications: none  Description of procedure: After obtaining informed consent the patient was taken to the operating room and placed prone on operating room table aftergeneral endotracheal anesthesia was initiated. Preoperative antibiotics were administered, SCDs applied, and a formal timeout was performed. The perineum was prepped and draped in usual sterile fashion. Initial external exam shows mild pilonidal disease with no active inflammation or infection. There is no external visible or palpable abnormality in the perianal region. Digital rectal exam was then performed, fullness was noted to the patient's left of midline posteriorly. Retractors were placed in the anal canal and and distal rectum inspected, there was no overtly visible fistular drainage of purulent fluid in the rectum. With one finger in the rectum, an 18-gauge needle on a syringe aimed at the full region that was palpable on digital rectal exam was used to access the abscess cavity which was about 6 cm from the skin surface again on the posterior lateral left side of the rectum. Just over 10 mL of purulent fluid was aspirated and this was sent for culture. The tract of the needle was followed with cautery to expand the wound. This was then probed with a hemostat gently confirming no residual loculations. While probing and I maintained one finger in the rectum, I did not identify any overt fistulous opening. The cavity was gently packed with half-inch Vaseline gauze and then a dry dressing applied.The patient was then  returned to the supine position,awakened, extubated and taken to PACU in stable condition.   All counts were correct at the completion of the case.

## 2017-05-18 ENCOUNTER — Encounter (HOSPITAL_COMMUNITY): Payer: Self-pay | Admitting: Surgery

## 2017-05-18 MED ORDER — AMOXICILLIN-POT CLAVULANATE 875-125 MG PO TABS: 1.0000 | ORAL_TABLET | Freq: Two times a day (BID) | ORAL | 0 refills | Status: DC

## 2017-05-18 NOTE — Discharge Summary (Signed)
[facility] Discharge Summary   Patient ID: Justin Hanson MRN: [id number] DOB/AGE: May 20, 1980 37 y.o.  Admit date: 05/16/2017 Discharge date: 05/18/2017  Admitting Diagnosis: Perirectal abscess  Discharge Diagnosis Patient Active Problem List   Diagnosis Date Noted  . Perirectal abscess 05/17/2017  . Obesity (BMI 30-39.9)   . OSA on CPAP     Consultants none  Imaging: Ct Abdomen Pelvis W Contrast  Result Date: 05/17/2017 CLINICAL DATA:  37 year old male with perirectal abscess. EXAM: CT ABDOMEN AND PELVIS WITH CONTRAST TECHNIQUE: Multidetector CT imaging of the abdomen and pelvis was performed using the standard protocol following bolus administration of intravenous contrast. CONTRAST:  <See Chart> ISOVUE-300 IOPAMIDOL (ISOVUE-300) INJECTION 61% COMPARISON:  None. FINDINGS: Lower chest: The visualized lung bases are clear. No intra-abdominal free air or free fluid. Hepatobiliary: No focal liver abnormality is seen. No gallstones, gallbladder wall thickening, or biliary dilatation. Pancreas: Unremarkable. No pancreatic ductal dilatation or surrounding inflammatory changes. Spleen: Normal in size without focal abnormality. Adrenals/Urinary Tract: Adrenal glands are unremarkable. Kidneys are normal, without renal calculi, focal lesion, or hydronephrosis. Bladder is unremarkable. Stomach/Bowel: There is a 3.0 x 3.0 x 2.3 cm low attenuating collection posterior to the anus inferior to the levator ani muscle and at the level of the puborectal muscle most consistent with an abscess. There is probably a fistulous track to the rectum (series 3 images 91-93). Evaluation however is limited in the absence of rectal contrast. The remainder of the bowel is unremarkable. There is no bowel obstruction or active inflammation. Normal appendix. Vascular/Lymphatic: No significant vascular findings are present. No enlarged abdominal or pelvic lymph nodes. Reproductive: The prostate and seminal  vesicles are grossly unremarkable. Other: None Musculoskeletal: No acute or significant osseous findings. IMPRESSION: Posterior perianal abscess with probable anal fistula. Clinical correlation is recommended. Electronically Signed   By: Elgie CollardArash  Radparvar M.D.   On: 05/17/2017 01:31    Procedures Dr. Fredricka Bonineonnor (05/17/17) - Incision and drainage of perirectal abscess   HPI: 37 yo male with 5 day history of pain and swelling at his anal area. It has gotten more painful each day. He denies drainage. He denies fevers. He had a pilonidal cyst drained 10 years ago.  Hospital Course:  Workup showed perirectal abscess.  Patient was admitted and underwent procedure listed above.  Tolerated procedure well and was transferred to the floor.  Diet was advanced as tolerated.  On POD#1, the patient was voiding well, tolerating diet, ambulating well, pain well controlled, vital signs stable, wound well appearing and felt stable for discharge home.  Patient will follow up in our office in 2 weeks and knows to call with questions or concerns.  He will call to confirm appointment date/time. I instructed pt to follow up at his PCP for a physical and lab work to include A1C. He has a strong family history of Type II diabetes and his blood sugars were slightly elevated.   Patient was discharged in good condition.  Physical Exam: General:  Alert, NAD, pleasant, cooperative Resp: rate and effort normal GU: packing removed from wound, no bleeding, scant purulent drainage, pt tolerated it well, very mild surrounding erythema Skin: warm and dry, no rashes noted  Allergies as of 05/18/2017   No Known Allergies     Medication List    TAKE these medications   amoxicillin-clavulanate 875-125 MG tablet Commonly known as:  AUGMENTIN Take 1 tablet by mouth every 12 (twelve) hours.        Follow-up Information  [facility], [place]. Go on 05/25/2017.   Specialty:  General Surgery Why:  Your appointment is  05/25/17 at 2:30PM. Please arrive 30 minutes prior to your appointment to check in and fill out paperwork. Bring photo ID and insurance information. Contact information: [address] [place] [address] [phone]          Signed: Joyce Copa [facility] 05/18/2017, 9:37 AM Pager: [phone] Consults: [phone] Mon-Fri 7:00 am-4:30 pm Sat-Sun 7:00 am-11:30 am

## 2017-05-18 NOTE — Progress Notes (Signed)
Discharged home today.Prescription,personal belongings,sitz bath paraphernalia given to patient. Instructions shown patient. rectal dressing changed. No further questions asked .

## 2017-05-18 NOTE — Anesthesia Postprocedure Evaluation (Signed)
Anesthesia Post Note  Patient: Justin Hanson  Procedure(s) Performed: INCISION AND DRAINAGE perirectal ABSCESS (N/A Rectum)     Patient location during evaluation: PACU Anesthesia Type: General Level of consciousness: awake and alert Pain management: pain level controlled Vital Signs Assessment: post-procedure vital signs reviewed and stable Respiratory status: spontaneous breathing, nonlabored ventilation, respiratory function stable and patient connected to nasal cannula oxygen Cardiovascular status: blood pressure returned to baseline and stable Postop Assessment: no apparent nausea or vomiting Anesthetic complications: no    Last Vitals:  Vitals:   05/18/17 0130 05/18/17 0525  BP: 125/64 116/68  Pulse: 68 77  Resp: 18 18  Temp: 36.8 C 36.8 C  SpO2: 96% 98%    Last Pain:  Vitals:   05/18/17 0525  TempSrc: Oral  PainSc:                  Blake Goya

## 2017-05-22 LAB — AEROBIC/ANAEROBIC CULTURE (SURGICAL/DEEP WOUND)

## 2017-05-22 LAB — AEROBIC/ANAEROBIC CULTURE W GRAM STAIN (SURGICAL/DEEP WOUND)

## 2018-12-01 IMAGING — CT CT ABD-PELV W/ CM
2 of 5 series · 15 of 46 positions shown, 17 images · IV contrast (APPLIED)
Comparison: None.

CLINICAL DATA: 36-year-old male with perirectal abscess.

EXAM:
CT ABDOMEN AND PELVIS WITH CONTRAST
TECHNIQUE: Multidetector CT imaging of the abdomen and pelvis was performed
using the standard protocol following bolus administration of
intravenous contrast.
CONTRAST:  <See Chart> QK1LYF-1HH IOPAMIDOL (QK1LYF-1HH) INJECTION
61%

[Series 3: abd/ pelvis 5.0 i30f 2 · axial · 0.98mm/px · z∈[+628,+1173]mm · 12 of 123 slices shown, 14 images]
[im 7/123  soft-tissue]
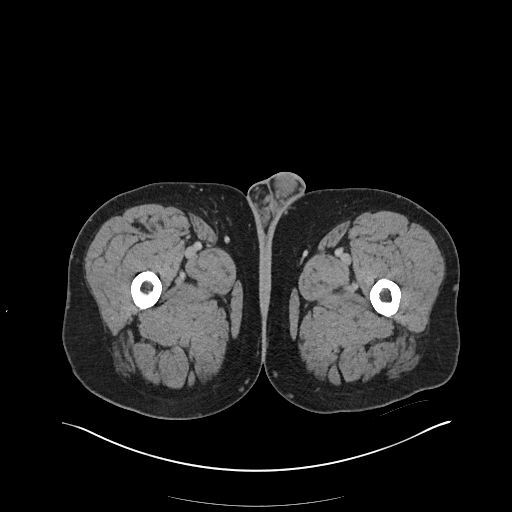
[im 7/123  bone]
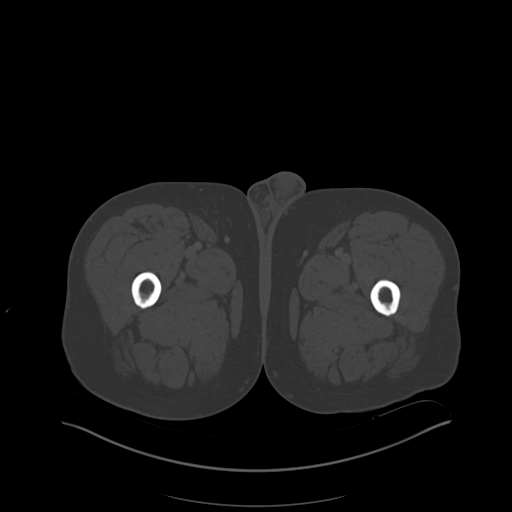
[im 19/123  soft-tissue]
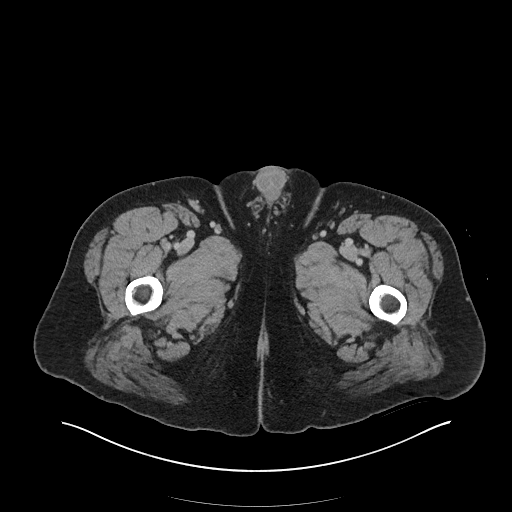
[im 25/123  soft-tissue]
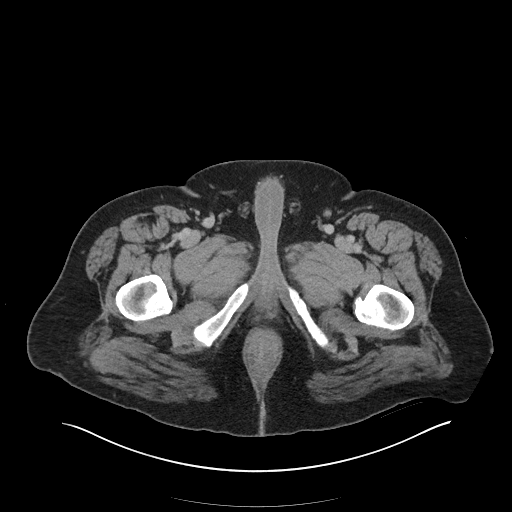
[im 37/123  soft-tissue]
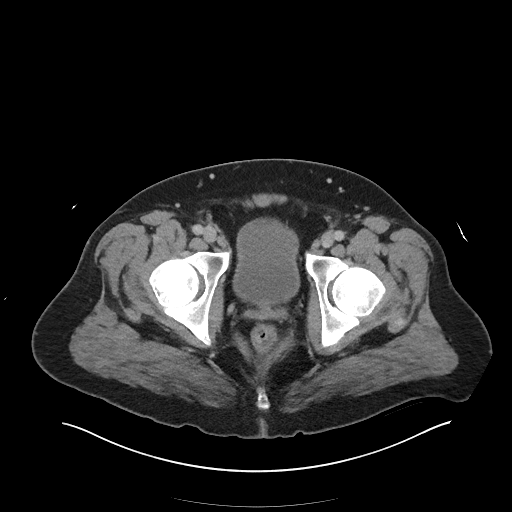
[im 49/123  soft-tissue]
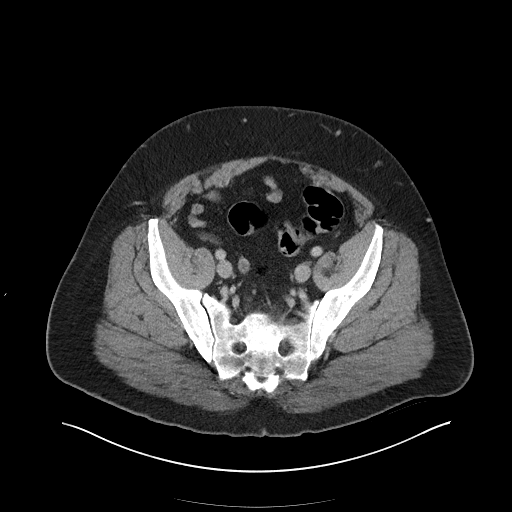
[im 55/123  soft-tissue]
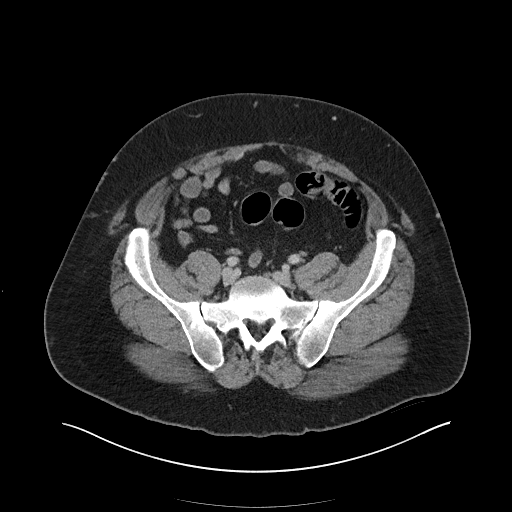
[im 68/123  soft-tissue]
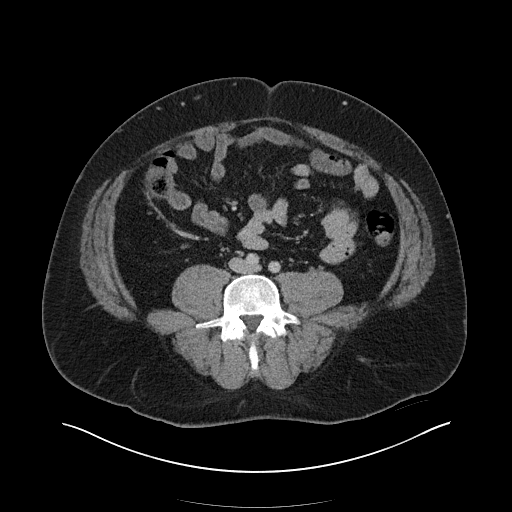
[im 74/123  soft-tissue]
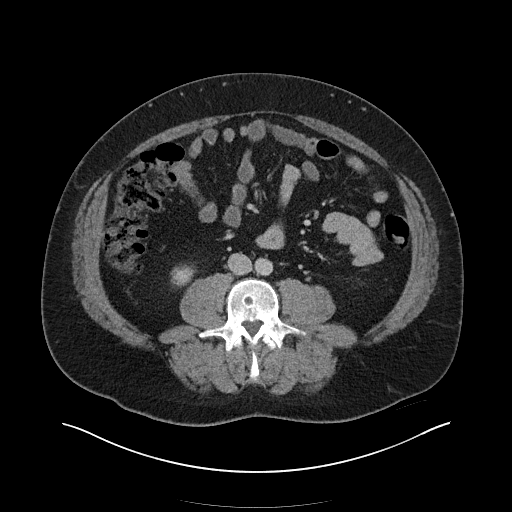
[im 86/123  soft-tissue]
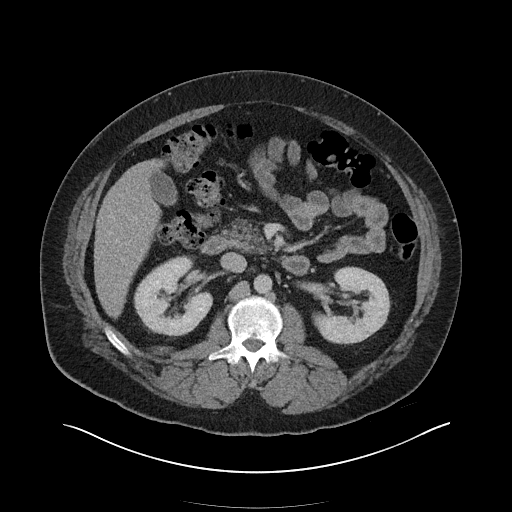
[im 86/123  bone]
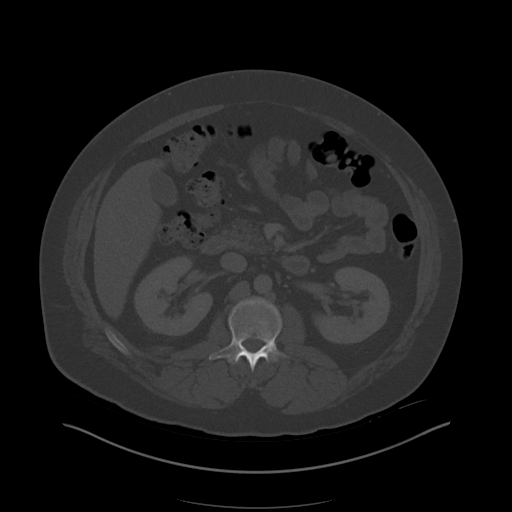
[im 98/123  soft-tissue]
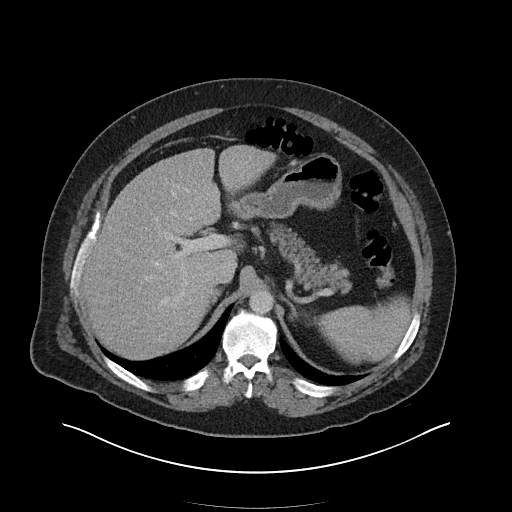
[im 104/123  soft-tissue]
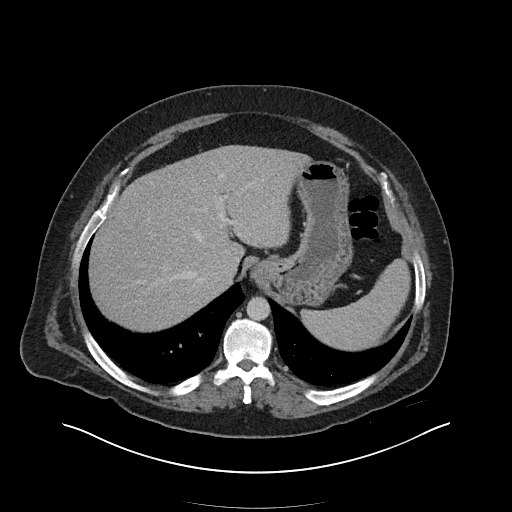
[im 116/123  soft-tissue]
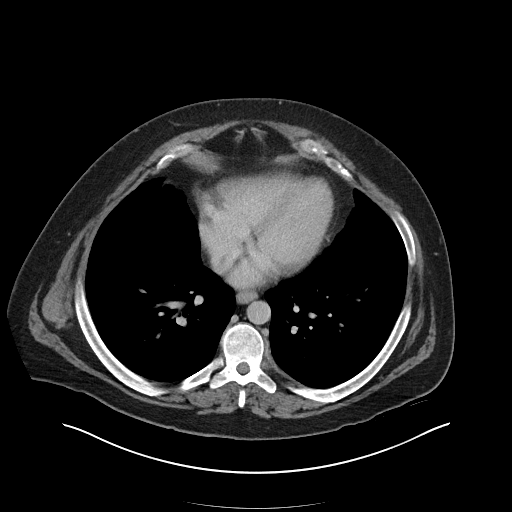

[Series 6: coronal soft tissue · coronal · 0.81mm/px · 3 of 104 slices shown]
[im 35/104  soft-tissue]
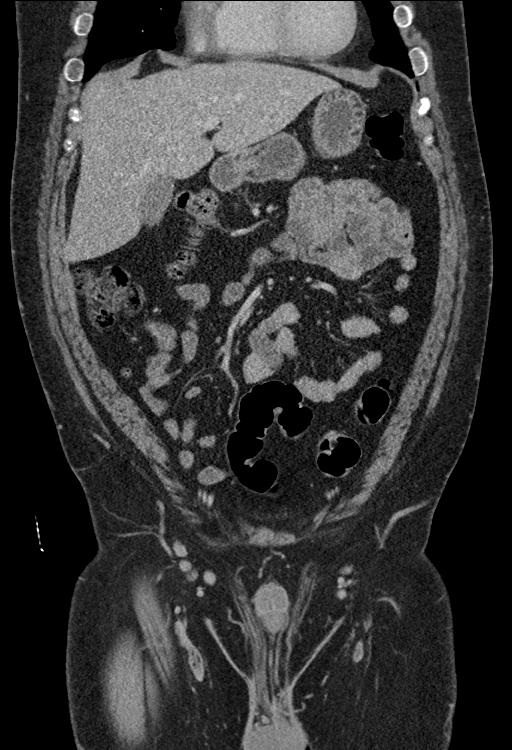
[im 46/104  soft-tissue]
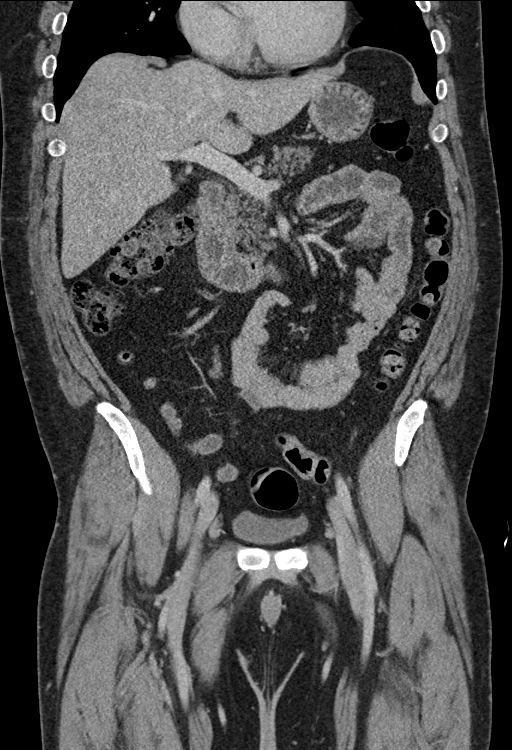
[im 58/104  soft-tissue]
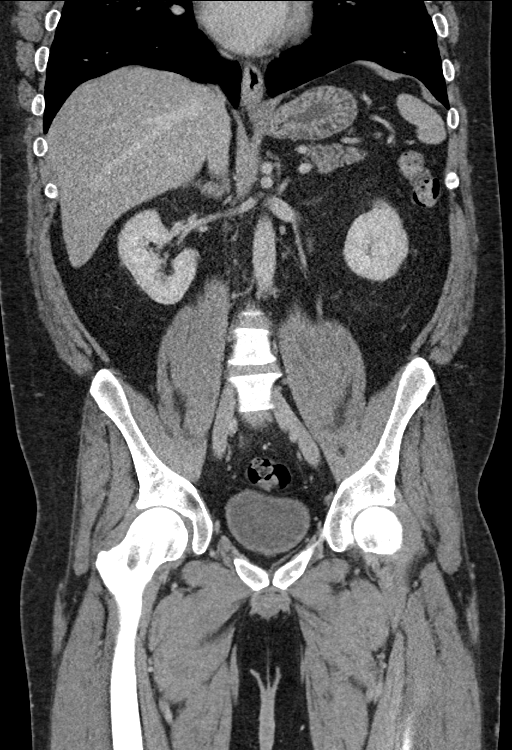

[15 of 46 positions shown; findings below may reference images not displayed]

FINDINGS: Lower chest: The visualized lung bases are clear.

No intra-abdominal free air or free fluid.

Hepatobiliary: No focal liver abnormality is seen. No gallstones,
gallbladder wall thickening, or biliary dilatation.

Pancreas: Unremarkable. No pancreatic ductal dilatation or
surrounding inflammatory changes.

Spleen: Normal in size without focal abnormality.

Adrenals/Urinary Tract: Adrenal glands are unremarkable. Kidneys are
normal, without renal calculi, focal lesion, or hydronephrosis.
Bladder is unremarkable.

Stomach/Bowel: There is a 3.0 x 3.0 x 2.3 cm low attenuating
collection posterior to the anus inferior to the levator ani muscle
and at the level of the puborectal muscle most consistent with an
abscess. There is probably a fistulous track to the rectum (series 3
images 91-93). Evaluation however is limited in the absence of
rectal contrast. The remainder of the bowel is unremarkable. There
is no bowel obstruction or active inflammation. Normal appendix.

Vascular/Lymphatic: No significant vascular findings are present. No
enlarged abdominal or pelvic lymph nodes.

Reproductive: The prostate and seminal vesicles are grossly
unremarkable.

Other: None

Musculoskeletal: No acute or significant osseous findings.
IMPRESSION: Posterior perianal abscess with probable anal fistula. Clinical
correlation is recommended.

## 2020-10-15 DIAGNOSIS — G4733 Obstructive sleep apnea (adult) (pediatric): Secondary | ICD-10-CM | POA: Diagnosis not present

## 2020-10-16 DIAGNOSIS — G4733 Obstructive sleep apnea (adult) (pediatric): Secondary | ICD-10-CM | POA: Diagnosis not present

## 2020-11-17 DIAGNOSIS — I10 Essential (primary) hypertension: Secondary | ICD-10-CM | POA: Diagnosis not present

## 2020-11-17 DIAGNOSIS — G4733 Obstructive sleep apnea (adult) (pediatric): Secondary | ICD-10-CM | POA: Diagnosis not present

## 2020-12-02 ENCOUNTER — Ambulatory Visit (INDEPENDENT_AMBULATORY_CARE_PROVIDER_SITE_OTHER): Payer: BC Managed Care – PPO | Admitting: Pulmonary Disease

## 2020-12-02 ENCOUNTER — Encounter: Payer: Self-pay | Admitting: Pulmonary Disease

## 2020-12-02 ENCOUNTER — Other Ambulatory Visit: Payer: Self-pay

## 2020-12-02 VITALS — BP 130/90 | HR 82 | Temp 98.5°F | Ht 70.0 in | Wt 280.6 lb

## 2020-12-02 DIAGNOSIS — J342 Deviated nasal septum: Secondary | ICD-10-CM

## 2020-12-02 DIAGNOSIS — R0602 Shortness of breath: Secondary | ICD-10-CM | POA: Diagnosis not present

## 2020-12-02 NOTE — Progress Notes (Signed)
Consider              Justin Hanson    [id number]    20-Sep-1980  Primary Care Physician:[facility]  Referring Physician: Wandra Scot, PA-C [id number] [place],  [place] [address]  Chief complaint:   Patient with a history of obstructive sleep apnea, cough, shortness of breath  HPI:  Obstructive sleep apnea diagnosed many years ago Just recently got a new machine-the Luna machine Trying to get used to using it regularly  He had a previous machine 8 to 10 years ago Quit using about 5 years ago  If feels he sleeps better with the CPAP on has mask issues-still working with DME to try and get this better  Cough, shortness of breath An active smoker -Cut down to about a pack a day from 2 packs -Over 40-pack-year smoking history  Usually goes to bed between 10 and 12 Takes him about 10 minutes to fall asleep 3-4 awakenings Final wake up time about 6:30 AM  Weight is up about 20 pounds  Is working on trying to lose weight  History of deviated septum which he had surgery for 2004  He is stuffy and congested a lot of times  He works in an [employer]  Outpatient Encounter Medications as of 12/02/2020  Medication Sig   amoxicillin-clavulanate (AUGMENTIN) 875-125 MG tablet Take 1 tablet by mouth every 12 (twelve) hours.   No facility-administered encounter medications on file as of 12/02/2020.    Allergies as of 12/02/2020   (No Known Allergies)    Past Medical History:  Diagnosis Date   Allergic rhinitis    Back pain    Carbuncle and furuncle of leg, except foot    Folliculitis    thighs with abscess of the posterior scalp   Obesity (BMI 30-39.9)    OSA on CPAP    severe with AHI 63.66/hr now on CPAP at 11cm H2O    Past Surgical History:  Procedure Laterality Date   INCISE AND DRAIN ABCESS  05/17/2017   perirectal   INCISION AND DRAINAGE ABSCESS N/A 05/17/2017   Procedure: INCISION AND DRAINAGE perirectal ABSCESS;   Surgeon: Berna Bue, MD;  Location: [facility] OR;  Service: General;  Laterality: N/A;   NASAL SEPTUM SURGERY      Family History  Problem Relation Age of Onset   Diabetes Father     Social History   Socioeconomic History   Marital status: Married    Spouse name: Not on file   Number of children: Not on file   Years of education: Not on file   Highest education level: Not on file  Occupational History   Not on file  Tobacco Use   Smoking status: Every Day    Packs/day: 1.00    Years: 20.00    Pack years: 20.00    Types: Cigarettes   Smokeless tobacco: Never   Tobacco comments:    electronic cigarettes  Vaping Use   Vaping Use: Never used  Substance and Sexual Activity   Alcohol use: Yes    Comment: daily   Drug use: Yes    Types: Marijuana   Sexual activity: Not on file  Other Topics Concern   Not on file  Social History Narrative   Not on file   Social Determinants of Health   Financial Resource Strain: Not on file  Food Insecurity: Not on file  Transportation Needs: Not on file  Physical  Activity: Not on file  Stress: Not on file  Social Connections: Not on file  Intimate Partner Violence: Not on file    Review of Systems  Constitutional:  Negative for fatigue.  Respiratory:  Positive for apnea.   Psychiatric/Behavioral:  Positive for sleep disturbance.    There were no vitals filed for this visit.   Physical Exam Constitutional:      Appearance: He is obese.  HENT:     Head: Normocephalic and atraumatic.     Nose: No congestion.     Mouth/Throat:     Mouth: Mucous membranes are moist.  Cardiovascular:     Rate and Rhythm: Normal rate and regular rhythm.     Heart sounds: No murmur heard.   No friction rub.  Pulmonary:     Effort: No respiratory distress.     Breath sounds: No stridor. No wheezing or rhonchi.     Comments: Decreased air movement bilaterally Musculoskeletal:     Cervical back: No rigidity or tenderness.  Neurological:      Mental Status: He is alert.  Psychiatric:        Mood and Affect: Mood normal.     Data Reviewed: Last sleep study was in 2015 showing severe obstructive sleep apnea He was titrated CPAP of 11  Assessment:  Obstructive sleep apnea  Deviated septum  Plan/Recommendations: Continue to try to use CPAP  Smoking cessation counseling counseling  Trial with nasal steroid  ENT referral for deviated septum  Compliance data was received from [facility] Has been trying to use CPAP on a nightly basis Mean pressure of 9.4 average pressure of 12.5 he does have some leak average AHI is 10 -We will continue to monitor his compliance   Virl Diamond MD [facility] 12/02/2020, 4:17 PM  CC: Wandra Scot, PA-C

## 2020-12-02 NOTE — Patient Instructions (Signed)
Obstructive sleep apnea -Continue CPAP use -Try and use it nightly  Active smoker -Continue efforts at quitting smoking -We will get a chest x-ray -We will get a breathing study  Nasal stuffiness/congestion -Trial with nasal steroid-Nasonex, Flonase examples   ENT referral for deviated septum  I will see you back in about 3 months  Call with significant concerns

## 2020-12-18 DIAGNOSIS — I10 Essential (primary) hypertension: Secondary | ICD-10-CM | POA: Diagnosis not present

## 2020-12-18 DIAGNOSIS — G4733 Obstructive sleep apnea (adult) (pediatric): Secondary | ICD-10-CM | POA: Diagnosis not present

## 2020-12-30 DIAGNOSIS — G4733 Obstructive sleep apnea (adult) (pediatric): Secondary | ICD-10-CM | POA: Diagnosis not present

## 2021-01-17 DIAGNOSIS — I10 Essential (primary) hypertension: Secondary | ICD-10-CM | POA: Diagnosis not present

## 2021-01-17 DIAGNOSIS — G4733 Obstructive sleep apnea (adult) (pediatric): Secondary | ICD-10-CM | POA: Diagnosis not present

## 2021-02-17 DIAGNOSIS — G4733 Obstructive sleep apnea (adult) (pediatric): Secondary | ICD-10-CM | POA: Diagnosis not present

## 2021-02-17 DIAGNOSIS — I10 Essential (primary) hypertension: Secondary | ICD-10-CM | POA: Diagnosis not present

## 2021-03-25 ENCOUNTER — Ambulatory Visit: Payer: BC Managed Care – PPO | Admitting: Pulmonary Disease

## 2021-11-13 DIAGNOSIS — M542 Cervicalgia: Secondary | ICD-10-CM | POA: Diagnosis not present

## 2021-11-13 DIAGNOSIS — F10129 Alcohol abuse with intoxication, unspecified: Secondary | ICD-10-CM | POA: Diagnosis not present

## 2021-11-13 DIAGNOSIS — Z720 Tobacco use: Secondary | ICD-10-CM | POA: Diagnosis not present

## 2021-11-13 DIAGNOSIS — R079 Chest pain, unspecified: Secondary | ICD-10-CM | POA: Diagnosis not present

## 2021-11-13 DIAGNOSIS — Y906 Blood alcohol level of 120-199 mg/100 ml: Secondary | ICD-10-CM | POA: Diagnosis not present

## 2021-11-13 DIAGNOSIS — R4182 Altered mental status, unspecified: Secondary | ICD-10-CM | POA: Diagnosis not present

## 2021-11-13 DIAGNOSIS — F17200 Nicotine dependence, unspecified, uncomplicated: Secondary | ICD-10-CM | POA: Diagnosis not present

## 2021-11-13 DIAGNOSIS — R519 Headache, unspecified: Secondary | ICD-10-CM | POA: Diagnosis not present

## 2022-05-31 DIAGNOSIS — L309 Dermatitis, unspecified: Secondary | ICD-10-CM | POA: Diagnosis not present

## 2022-05-31 DIAGNOSIS — Z1339 Encounter for screening examination for other mental health and behavioral disorders: Secondary | ICD-10-CM | POA: Diagnosis not present

## 2022-05-31 DIAGNOSIS — Z125 Encounter for screening for malignant neoplasm of prostate: Secondary | ICD-10-CM | POA: Diagnosis not present

## 2022-05-31 DIAGNOSIS — Z789 Other specified health status: Secondary | ICD-10-CM | POA: Diagnosis not present

## 2022-05-31 DIAGNOSIS — F909 Attention-deficit hyperactivity disorder, unspecified type: Secondary | ICD-10-CM | POA: Diagnosis not present

## 2022-05-31 DIAGNOSIS — L0292 Furuncle, unspecified: Secondary | ICD-10-CM | POA: Diagnosis not present

## 2022-05-31 DIAGNOSIS — F1990 Other psychoactive substance use, unspecified, uncomplicated: Secondary | ICD-10-CM | POA: Diagnosis not present

## 2022-05-31 DIAGNOSIS — Z13220 Encounter for screening for lipoid disorders: Secondary | ICD-10-CM | POA: Diagnosis not present

## 2022-05-31 DIAGNOSIS — Z1331 Encounter for screening for depression: Secondary | ICD-10-CM | POA: Diagnosis not present

## 2022-07-20 DIAGNOSIS — Z7151 Drug abuse counseling and surveillance of drug abuser: Secondary | ICD-10-CM | POA: Diagnosis not present

## 2022-07-20 DIAGNOSIS — F1120 Opioid dependence, uncomplicated: Secondary | ICD-10-CM | POA: Diagnosis not present

## 2022-07-20 DIAGNOSIS — F1121 Opioid dependence, in remission: Secondary | ICD-10-CM | POA: Diagnosis not present

## 2022-07-20 DIAGNOSIS — F1020 Alcohol dependence, uncomplicated: Secondary | ICD-10-CM | POA: Diagnosis not present

## 2022-07-21 DIAGNOSIS — F1120 Opioid dependence, uncomplicated: Secondary | ICD-10-CM | POA: Diagnosis not present

## 2022-07-21 DIAGNOSIS — F1020 Alcohol dependence, uncomplicated: Secondary | ICD-10-CM | POA: Diagnosis not present

## 2022-07-27 DIAGNOSIS — F1420 Cocaine dependence, uncomplicated: Secondary | ICD-10-CM | POA: Diagnosis not present

## 2022-07-27 DIAGNOSIS — Z7151 Drug abuse counseling and surveillance of drug abuser: Secondary | ICD-10-CM | POA: Diagnosis not present

## 2022-07-27 DIAGNOSIS — F1020 Alcohol dependence, uncomplicated: Secondary | ICD-10-CM | POA: Diagnosis not present

## 2022-07-27 DIAGNOSIS — F1120 Opioid dependence, uncomplicated: Secondary | ICD-10-CM | POA: Diagnosis not present

## 2022-07-28 DIAGNOSIS — F1120 Opioid dependence, uncomplicated: Secondary | ICD-10-CM | POA: Diagnosis not present

## 2022-07-28 DIAGNOSIS — Z7151 Drug abuse counseling and surveillance of drug abuser: Secondary | ICD-10-CM | POA: Diagnosis not present

## 2022-08-03 DIAGNOSIS — G4733 Obstructive sleep apnea (adult) (pediatric): Secondary | ICD-10-CM | POA: Diagnosis not present

## 2022-08-03 DIAGNOSIS — Z6836 Body mass index (BMI) 36.0-36.9, adult: Secondary | ICD-10-CM | POA: Diagnosis not present

## 2022-08-03 DIAGNOSIS — F1020 Alcohol dependence, uncomplicated: Secondary | ICD-10-CM | POA: Diagnosis not present

## 2022-08-03 DIAGNOSIS — F1220 Cannabis dependence, uncomplicated: Secondary | ICD-10-CM | POA: Diagnosis not present

## 2022-08-03 DIAGNOSIS — G4701 Insomnia due to medical condition: Secondary | ICD-10-CM | POA: Diagnosis not present

## 2022-08-03 DIAGNOSIS — J309 Allergic rhinitis, unspecified: Secondary | ICD-10-CM | POA: Diagnosis not present

## 2022-08-03 DIAGNOSIS — F1520 Other stimulant dependence, uncomplicated: Secondary | ICD-10-CM | POA: Diagnosis not present

## 2022-08-03 DIAGNOSIS — F419 Anxiety disorder, unspecified: Secondary | ICD-10-CM | POA: Diagnosis not present

## 2022-08-03 DIAGNOSIS — F1420 Cocaine dependence, uncomplicated: Secondary | ICD-10-CM | POA: Diagnosis not present

## 2022-08-03 DIAGNOSIS — F17200 Nicotine dependence, unspecified, uncomplicated: Secondary | ICD-10-CM | POA: Diagnosis not present

## 2022-08-03 DIAGNOSIS — F1120 Opioid dependence, uncomplicated: Secondary | ICD-10-CM | POA: Diagnosis not present

## 2022-08-03 DIAGNOSIS — F329 Major depressive disorder, single episode, unspecified: Secondary | ICD-10-CM | POA: Diagnosis not present

## 2022-08-08 DIAGNOSIS — F419 Anxiety disorder, unspecified: Secondary | ICD-10-CM | POA: Diagnosis not present

## 2022-08-08 DIAGNOSIS — G4701 Insomnia due to medical condition: Secondary | ICD-10-CM | POA: Diagnosis not present

## 2022-08-08 DIAGNOSIS — F1020 Alcohol dependence, uncomplicated: Secondary | ICD-10-CM | POA: Diagnosis not present

## 2022-08-08 DIAGNOSIS — F1520 Other stimulant dependence, uncomplicated: Secondary | ICD-10-CM | POA: Diagnosis not present

## 2022-08-08 DIAGNOSIS — Z6836 Body mass index (BMI) 36.0-36.9, adult: Secondary | ICD-10-CM | POA: Diagnosis not present

## 2022-08-08 DIAGNOSIS — F17200 Nicotine dependence, unspecified, uncomplicated: Secondary | ICD-10-CM | POA: Diagnosis not present

## 2022-08-08 DIAGNOSIS — J309 Allergic rhinitis, unspecified: Secondary | ICD-10-CM | POA: Diagnosis not present

## 2022-08-08 DIAGNOSIS — F1220 Cannabis dependence, uncomplicated: Secondary | ICD-10-CM | POA: Diagnosis not present

## 2022-08-08 DIAGNOSIS — F329 Major depressive disorder, single episode, unspecified: Secondary | ICD-10-CM | POA: Diagnosis not present

## 2022-08-08 DIAGNOSIS — G4733 Obstructive sleep apnea (adult) (pediatric): Secondary | ICD-10-CM | POA: Diagnosis not present

## 2022-08-08 DIAGNOSIS — F1120 Opioid dependence, uncomplicated: Secondary | ICD-10-CM | POA: Diagnosis not present

## 2022-08-08 DIAGNOSIS — F1420 Cocaine dependence, uncomplicated: Secondary | ICD-10-CM | POA: Diagnosis not present

## 2022-08-20 DIAGNOSIS — F419 Anxiety disorder, unspecified: Secondary | ICD-10-CM | POA: Diagnosis not present

## 2022-08-20 DIAGNOSIS — F1020 Alcohol dependence, uncomplicated: Secondary | ICD-10-CM | POA: Diagnosis not present

## 2022-08-20 DIAGNOSIS — G4733 Obstructive sleep apnea (adult) (pediatric): Secondary | ICD-10-CM | POA: Diagnosis not present

## 2022-08-20 DIAGNOSIS — F17200 Nicotine dependence, unspecified, uncomplicated: Secondary | ICD-10-CM | POA: Diagnosis not present

## 2022-08-20 DIAGNOSIS — F329 Major depressive disorder, single episode, unspecified: Secondary | ICD-10-CM | POA: Diagnosis not present

## 2022-08-20 DIAGNOSIS — F1520 Other stimulant dependence, uncomplicated: Secondary | ICD-10-CM | POA: Diagnosis not present

## 2022-08-20 DIAGNOSIS — F1120 Opioid dependence, uncomplicated: Secondary | ICD-10-CM | POA: Diagnosis not present

## 2022-08-20 DIAGNOSIS — F1220 Cannabis dependence, uncomplicated: Secondary | ICD-10-CM | POA: Diagnosis not present

## 2022-08-20 DIAGNOSIS — J309 Allergic rhinitis, unspecified: Secondary | ICD-10-CM | POA: Diagnosis not present

## 2022-08-20 DIAGNOSIS — Z6836 Body mass index (BMI) 36.0-36.9, adult: Secondary | ICD-10-CM | POA: Diagnosis not present

## 2022-08-20 DIAGNOSIS — F1420 Cocaine dependence, uncomplicated: Secondary | ICD-10-CM | POA: Diagnosis not present

## 2022-08-20 DIAGNOSIS — G4701 Insomnia due to medical condition: Secondary | ICD-10-CM | POA: Diagnosis not present

## 2022-08-26 DIAGNOSIS — F1420 Cocaine dependence, uncomplicated: Secondary | ICD-10-CM | POA: Diagnosis not present

## 2022-08-26 DIAGNOSIS — F419 Anxiety disorder, unspecified: Secondary | ICD-10-CM | POA: Diagnosis not present

## 2022-08-26 DIAGNOSIS — F17200 Nicotine dependence, unspecified, uncomplicated: Secondary | ICD-10-CM | POA: Diagnosis not present

## 2022-08-26 DIAGNOSIS — F1220 Cannabis dependence, uncomplicated: Secondary | ICD-10-CM | POA: Diagnosis not present

## 2022-08-26 DIAGNOSIS — G4701 Insomnia due to medical condition: Secondary | ICD-10-CM | POA: Diagnosis not present

## 2022-08-26 DIAGNOSIS — F329 Major depressive disorder, single episode, unspecified: Secondary | ICD-10-CM | POA: Diagnosis not present

## 2022-08-26 DIAGNOSIS — G4733 Obstructive sleep apnea (adult) (pediatric): Secondary | ICD-10-CM | POA: Diagnosis not present

## 2022-08-26 DIAGNOSIS — F1120 Opioid dependence, uncomplicated: Secondary | ICD-10-CM | POA: Diagnosis not present

## 2022-08-26 DIAGNOSIS — J309 Allergic rhinitis, unspecified: Secondary | ICD-10-CM | POA: Diagnosis not present

## 2022-08-26 DIAGNOSIS — Z6836 Body mass index (BMI) 36.0-36.9, adult: Secondary | ICD-10-CM | POA: Diagnosis not present

## 2022-08-26 DIAGNOSIS — F1020 Alcohol dependence, uncomplicated: Secondary | ICD-10-CM | POA: Diagnosis not present

## 2022-08-26 DIAGNOSIS — F1520 Other stimulant dependence, uncomplicated: Secondary | ICD-10-CM | POA: Diagnosis not present

## 2022-08-27 DIAGNOSIS — Z6836 Body mass index (BMI) 36.0-36.9, adult: Secondary | ICD-10-CM | POA: Diagnosis not present

## 2022-08-27 DIAGNOSIS — G4733 Obstructive sleep apnea (adult) (pediatric): Secondary | ICD-10-CM | POA: Diagnosis not present

## 2022-08-27 DIAGNOSIS — J309 Allergic rhinitis, unspecified: Secondary | ICD-10-CM | POA: Diagnosis not present

## 2022-08-27 DIAGNOSIS — F329 Major depressive disorder, single episode, unspecified: Secondary | ICD-10-CM | POA: Diagnosis not present

## 2022-08-27 DIAGNOSIS — F1120 Opioid dependence, uncomplicated: Secondary | ICD-10-CM | POA: Diagnosis not present

## 2022-08-27 DIAGNOSIS — F1420 Cocaine dependence, uncomplicated: Secondary | ICD-10-CM | POA: Diagnosis not present

## 2022-08-27 DIAGNOSIS — F1020 Alcohol dependence, uncomplicated: Secondary | ICD-10-CM | POA: Diagnosis not present

## 2022-08-27 DIAGNOSIS — F1520 Other stimulant dependence, uncomplicated: Secondary | ICD-10-CM | POA: Diagnosis not present

## 2022-08-27 DIAGNOSIS — F1220 Cannabis dependence, uncomplicated: Secondary | ICD-10-CM | POA: Diagnosis not present

## 2022-08-27 DIAGNOSIS — F17200 Nicotine dependence, unspecified, uncomplicated: Secondary | ICD-10-CM | POA: Diagnosis not present

## 2022-08-27 DIAGNOSIS — F419 Anxiety disorder, unspecified: Secondary | ICD-10-CM | POA: Diagnosis not present

## 2022-08-27 DIAGNOSIS — G4701 Insomnia due to medical condition: Secondary | ICD-10-CM | POA: Diagnosis not present

## 2022-08-28 DIAGNOSIS — F419 Anxiety disorder, unspecified: Secondary | ICD-10-CM | POA: Diagnosis not present

## 2022-08-28 DIAGNOSIS — J309 Allergic rhinitis, unspecified: Secondary | ICD-10-CM | POA: Diagnosis not present

## 2022-08-28 DIAGNOSIS — F1420 Cocaine dependence, uncomplicated: Secondary | ICD-10-CM | POA: Diagnosis not present

## 2022-08-28 DIAGNOSIS — F1020 Alcohol dependence, uncomplicated: Secondary | ICD-10-CM | POA: Diagnosis not present

## 2022-08-28 DIAGNOSIS — F1520 Other stimulant dependence, uncomplicated: Secondary | ICD-10-CM | POA: Diagnosis not present

## 2022-08-28 DIAGNOSIS — G4733 Obstructive sleep apnea (adult) (pediatric): Secondary | ICD-10-CM | POA: Diagnosis not present

## 2022-08-28 DIAGNOSIS — F17200 Nicotine dependence, unspecified, uncomplicated: Secondary | ICD-10-CM | POA: Diagnosis not present

## 2022-08-28 DIAGNOSIS — F1120 Opioid dependence, uncomplicated: Secondary | ICD-10-CM | POA: Diagnosis not present

## 2022-08-28 DIAGNOSIS — G4701 Insomnia due to medical condition: Secondary | ICD-10-CM | POA: Diagnosis not present

## 2022-08-28 DIAGNOSIS — F1220 Cannabis dependence, uncomplicated: Secondary | ICD-10-CM | POA: Diagnosis not present

## 2022-08-28 DIAGNOSIS — Z6836 Body mass index (BMI) 36.0-36.9, adult: Secondary | ICD-10-CM | POA: Diagnosis not present

## 2022-08-28 DIAGNOSIS — F329 Major depressive disorder, single episode, unspecified: Secondary | ICD-10-CM | POA: Diagnosis not present

## 2022-08-29 DIAGNOSIS — F1420 Cocaine dependence, uncomplicated: Secondary | ICD-10-CM | POA: Diagnosis not present

## 2022-08-29 DIAGNOSIS — J309 Allergic rhinitis, unspecified: Secondary | ICD-10-CM | POA: Diagnosis not present

## 2022-08-29 DIAGNOSIS — F1220 Cannabis dependence, uncomplicated: Secondary | ICD-10-CM | POA: Diagnosis not present

## 2022-08-29 DIAGNOSIS — F419 Anxiety disorder, unspecified: Secondary | ICD-10-CM | POA: Diagnosis not present

## 2022-08-29 DIAGNOSIS — F1520 Other stimulant dependence, uncomplicated: Secondary | ICD-10-CM | POA: Diagnosis not present

## 2022-08-29 DIAGNOSIS — F17200 Nicotine dependence, unspecified, uncomplicated: Secondary | ICD-10-CM | POA: Diagnosis not present

## 2022-08-29 DIAGNOSIS — F329 Major depressive disorder, single episode, unspecified: Secondary | ICD-10-CM | POA: Diagnosis not present

## 2022-08-29 DIAGNOSIS — G4733 Obstructive sleep apnea (adult) (pediatric): Secondary | ICD-10-CM | POA: Diagnosis not present

## 2022-08-29 DIAGNOSIS — Z6836 Body mass index (BMI) 36.0-36.9, adult: Secondary | ICD-10-CM | POA: Diagnosis not present

## 2022-08-29 DIAGNOSIS — F1120 Opioid dependence, uncomplicated: Secondary | ICD-10-CM | POA: Diagnosis not present

## 2022-08-29 DIAGNOSIS — F1020 Alcohol dependence, uncomplicated: Secondary | ICD-10-CM | POA: Diagnosis not present

## 2022-08-29 DIAGNOSIS — G4701 Insomnia due to medical condition: Secondary | ICD-10-CM | POA: Diagnosis not present

## 2022-08-30 DIAGNOSIS — Z6836 Body mass index (BMI) 36.0-36.9, adult: Secondary | ICD-10-CM | POA: Diagnosis not present

## 2022-08-30 DIAGNOSIS — J309 Allergic rhinitis, unspecified: Secondary | ICD-10-CM | POA: Diagnosis not present

## 2022-08-30 DIAGNOSIS — F1220 Cannabis dependence, uncomplicated: Secondary | ICD-10-CM | POA: Diagnosis not present

## 2022-08-30 DIAGNOSIS — G4733 Obstructive sleep apnea (adult) (pediatric): Secondary | ICD-10-CM | POA: Diagnosis not present

## 2022-08-30 DIAGNOSIS — F1020 Alcohol dependence, uncomplicated: Secondary | ICD-10-CM | POA: Diagnosis not present

## 2022-08-30 DIAGNOSIS — F1120 Opioid dependence, uncomplicated: Secondary | ICD-10-CM | POA: Diagnosis not present

## 2022-08-30 DIAGNOSIS — F1420 Cocaine dependence, uncomplicated: Secondary | ICD-10-CM | POA: Diagnosis not present

## 2022-08-30 DIAGNOSIS — G4701 Insomnia due to medical condition: Secondary | ICD-10-CM | POA: Diagnosis not present

## 2022-08-30 DIAGNOSIS — F419 Anxiety disorder, unspecified: Secondary | ICD-10-CM | POA: Diagnosis not present

## 2022-08-30 DIAGNOSIS — F1520 Other stimulant dependence, uncomplicated: Secondary | ICD-10-CM | POA: Diagnosis not present

## 2022-08-30 DIAGNOSIS — F17200 Nicotine dependence, unspecified, uncomplicated: Secondary | ICD-10-CM | POA: Diagnosis not present

## 2022-08-30 DIAGNOSIS — F329 Major depressive disorder, single episode, unspecified: Secondary | ICD-10-CM | POA: Diagnosis not present

## 2022-08-31 DIAGNOSIS — Z6836 Body mass index (BMI) 36.0-36.9, adult: Secondary | ICD-10-CM | POA: Diagnosis not present

## 2022-08-31 DIAGNOSIS — F419 Anxiety disorder, unspecified: Secondary | ICD-10-CM | POA: Diagnosis not present

## 2022-08-31 DIAGNOSIS — F329 Major depressive disorder, single episode, unspecified: Secondary | ICD-10-CM | POA: Diagnosis not present

## 2022-08-31 DIAGNOSIS — G4733 Obstructive sleep apnea (adult) (pediatric): Secondary | ICD-10-CM | POA: Diagnosis not present

## 2022-08-31 DIAGNOSIS — F1020 Alcohol dependence, uncomplicated: Secondary | ICD-10-CM | POA: Diagnosis not present

## 2022-08-31 DIAGNOSIS — F17200 Nicotine dependence, unspecified, uncomplicated: Secondary | ICD-10-CM | POA: Diagnosis not present

## 2022-08-31 DIAGNOSIS — F1120 Opioid dependence, uncomplicated: Secondary | ICD-10-CM | POA: Diagnosis not present

## 2022-08-31 DIAGNOSIS — J309 Allergic rhinitis, unspecified: Secondary | ICD-10-CM | POA: Diagnosis not present

## 2022-08-31 DIAGNOSIS — F1420 Cocaine dependence, uncomplicated: Secondary | ICD-10-CM | POA: Diagnosis not present

## 2022-08-31 DIAGNOSIS — G4701 Insomnia due to medical condition: Secondary | ICD-10-CM | POA: Diagnosis not present

## 2022-08-31 DIAGNOSIS — F1220 Cannabis dependence, uncomplicated: Secondary | ICD-10-CM | POA: Diagnosis not present

## 2022-08-31 DIAGNOSIS — F1520 Other stimulant dependence, uncomplicated: Secondary | ICD-10-CM | POA: Diagnosis not present

## 2022-09-01 DIAGNOSIS — F1220 Cannabis dependence, uncomplicated: Secondary | ICD-10-CM | POA: Diagnosis not present

## 2022-09-01 DIAGNOSIS — F1120 Opioid dependence, uncomplicated: Secondary | ICD-10-CM | POA: Diagnosis not present

## 2022-09-01 DIAGNOSIS — F1420 Cocaine dependence, uncomplicated: Secondary | ICD-10-CM | POA: Diagnosis not present

## 2022-09-01 DIAGNOSIS — Z6836 Body mass index (BMI) 36.0-36.9, adult: Secondary | ICD-10-CM | POA: Diagnosis not present

## 2022-09-01 DIAGNOSIS — G4733 Obstructive sleep apnea (adult) (pediatric): Secondary | ICD-10-CM | POA: Diagnosis not present

## 2022-09-01 DIAGNOSIS — J309 Allergic rhinitis, unspecified: Secondary | ICD-10-CM | POA: Diagnosis not present

## 2022-09-01 DIAGNOSIS — F419 Anxiety disorder, unspecified: Secondary | ICD-10-CM | POA: Diagnosis not present

## 2022-09-01 DIAGNOSIS — F17200 Nicotine dependence, unspecified, uncomplicated: Secondary | ICD-10-CM | POA: Diagnosis not present

## 2022-09-01 DIAGNOSIS — F329 Major depressive disorder, single episode, unspecified: Secondary | ICD-10-CM | POA: Diagnosis not present

## 2022-09-01 DIAGNOSIS — F1020 Alcohol dependence, uncomplicated: Secondary | ICD-10-CM | POA: Diagnosis not present

## 2022-09-01 DIAGNOSIS — G4701 Insomnia due to medical condition: Secondary | ICD-10-CM | POA: Diagnosis not present

## 2022-09-01 DIAGNOSIS — F1520 Other stimulant dependence, uncomplicated: Secondary | ICD-10-CM | POA: Diagnosis not present

## 2022-09-05 DIAGNOSIS — F1020 Alcohol dependence, uncomplicated: Secondary | ICD-10-CM | POA: Diagnosis not present

## 2022-09-05 DIAGNOSIS — F1420 Cocaine dependence, uncomplicated: Secondary | ICD-10-CM | POA: Diagnosis not present

## 2022-09-05 DIAGNOSIS — F1120 Opioid dependence, uncomplicated: Secondary | ICD-10-CM | POA: Diagnosis not present

## 2022-09-05 DIAGNOSIS — Z7151 Drug abuse counseling and surveillance of drug abuser: Secondary | ICD-10-CM | POA: Diagnosis not present

## 2022-09-06 DIAGNOSIS — F1020 Alcohol dependence, uncomplicated: Secondary | ICD-10-CM | POA: Diagnosis not present

## 2022-09-06 DIAGNOSIS — Z7151 Drug abuse counseling and surveillance of drug abuser: Secondary | ICD-10-CM | POA: Diagnosis not present

## 2022-09-06 DIAGNOSIS — F1420 Cocaine dependence, uncomplicated: Secondary | ICD-10-CM | POA: Diagnosis not present

## 2022-09-08 DIAGNOSIS — Z7151 Drug abuse counseling and surveillance of drug abuser: Secondary | ICD-10-CM | POA: Diagnosis not present

## 2022-09-08 DIAGNOSIS — F1120 Opioid dependence, uncomplicated: Secondary | ICD-10-CM | POA: Diagnosis not present

## 2022-09-12 DIAGNOSIS — F1290 Cannabis use, unspecified, uncomplicated: Secondary | ICD-10-CM | POA: Diagnosis not present

## 2022-09-12 DIAGNOSIS — Z7151 Drug abuse counseling and surveillance of drug abuser: Secondary | ICD-10-CM | POA: Diagnosis not present

## 2022-09-12 DIAGNOSIS — F1120 Opioid dependence, uncomplicated: Secondary | ICD-10-CM | POA: Diagnosis not present

## 2022-09-12 DIAGNOSIS — F1420 Cocaine dependence, uncomplicated: Secondary | ICD-10-CM | POA: Diagnosis not present

## 2022-09-13 DIAGNOSIS — F1020 Alcohol dependence, uncomplicated: Secondary | ICD-10-CM | POA: Diagnosis not present

## 2022-09-13 DIAGNOSIS — F32A Depression, unspecified: Secondary | ICD-10-CM | POA: Diagnosis not present

## 2022-09-13 DIAGNOSIS — F1290 Cannabis use, unspecified, uncomplicated: Secondary | ICD-10-CM | POA: Diagnosis not present

## 2022-09-13 DIAGNOSIS — F1420 Cocaine dependence, uncomplicated: Secondary | ICD-10-CM | POA: Diagnosis not present

## 2022-09-15 DIAGNOSIS — F1120 Opioid dependence, uncomplicated: Secondary | ICD-10-CM | POA: Diagnosis not present

## 2022-09-15 DIAGNOSIS — Z7151 Drug abuse counseling and surveillance of drug abuser: Secondary | ICD-10-CM | POA: Diagnosis not present

## 2022-09-19 DIAGNOSIS — F1020 Alcohol dependence, uncomplicated: Secondary | ICD-10-CM | POA: Diagnosis not present

## 2022-09-19 DIAGNOSIS — F1120 Opioid dependence, uncomplicated: Secondary | ICD-10-CM | POA: Diagnosis not present

## 2022-09-19 DIAGNOSIS — Z7151 Drug abuse counseling and surveillance of drug abuser: Secondary | ICD-10-CM | POA: Diagnosis not present

## 2022-09-19 DIAGNOSIS — F1121 Opioid dependence, in remission: Secondary | ICD-10-CM | POA: Diagnosis not present

## 2022-09-19 DIAGNOSIS — F1290 Cannabis use, unspecified, uncomplicated: Secondary | ICD-10-CM | POA: Diagnosis not present

## 2022-09-22 DIAGNOSIS — Z7151 Drug abuse counseling and surveillance of drug abuser: Secondary | ICD-10-CM | POA: Diagnosis not present

## 2022-09-22 DIAGNOSIS — F1290 Cannabis use, unspecified, uncomplicated: Secondary | ICD-10-CM | POA: Diagnosis not present

## 2022-09-26 DIAGNOSIS — F1290 Cannabis use, unspecified, uncomplicated: Secondary | ICD-10-CM | POA: Diagnosis not present

## 2022-09-26 DIAGNOSIS — Z7151 Drug abuse counseling and surveillance of drug abuser: Secondary | ICD-10-CM | POA: Diagnosis not present

## 2022-09-27 DIAGNOSIS — F419 Anxiety disorder, unspecified: Secondary | ICD-10-CM | POA: Diagnosis not present

## 2022-09-27 DIAGNOSIS — Z7151 Drug abuse counseling and surveillance of drug abuser: Secondary | ICD-10-CM | POA: Diagnosis not present

## 2022-09-27 DIAGNOSIS — F32A Depression, unspecified: Secondary | ICD-10-CM | POA: Diagnosis not present

## 2022-09-27 DIAGNOSIS — F1020 Alcohol dependence, uncomplicated: Secondary | ICD-10-CM | POA: Diagnosis not present

## 2022-09-29 DIAGNOSIS — F1120 Opioid dependence, uncomplicated: Secondary | ICD-10-CM | POA: Diagnosis not present

## 2022-09-29 DIAGNOSIS — Z7151 Drug abuse counseling and surveillance of drug abuser: Secondary | ICD-10-CM | POA: Diagnosis not present

## 2022-10-03 DIAGNOSIS — F1290 Cannabis use, unspecified, uncomplicated: Secondary | ICD-10-CM | POA: Diagnosis not present

## 2022-10-03 DIAGNOSIS — Z7151 Drug abuse counseling and surveillance of drug abuser: Secondary | ICD-10-CM | POA: Diagnosis not present

## 2022-10-03 DIAGNOSIS — F1120 Opioid dependence, uncomplicated: Secondary | ICD-10-CM | POA: Diagnosis not present

## 2022-10-06 DIAGNOSIS — F1290 Cannabis use, unspecified, uncomplicated: Secondary | ICD-10-CM | POA: Diagnosis not present

## 2022-10-06 DIAGNOSIS — Z7151 Drug abuse counseling and surveillance of drug abuser: Secondary | ICD-10-CM | POA: Diagnosis not present

## 2022-10-10 DIAGNOSIS — F1121 Opioid dependence, in remission: Secondary | ICD-10-CM | POA: Diagnosis not present

## 2022-10-10 DIAGNOSIS — Z7151 Drug abuse counseling and surveillance of drug abuser: Secondary | ICD-10-CM | POA: Diagnosis not present

## 2022-10-10 DIAGNOSIS — F1020 Alcohol dependence, uncomplicated: Secondary | ICD-10-CM | POA: Diagnosis not present

## 2022-10-10 DIAGNOSIS — F1420 Cocaine dependence, uncomplicated: Secondary | ICD-10-CM | POA: Diagnosis not present

## 2022-10-11 DIAGNOSIS — Z7151 Drug abuse counseling and surveillance of drug abuser: Secondary | ICD-10-CM | POA: Diagnosis not present

## 2022-10-11 DIAGNOSIS — F1020 Alcohol dependence, uncomplicated: Secondary | ICD-10-CM | POA: Diagnosis not present

## 2022-10-11 DIAGNOSIS — F419 Anxiety disorder, unspecified: Secondary | ICD-10-CM | POA: Diagnosis not present

## 2022-10-11 DIAGNOSIS — F32A Depression, unspecified: Secondary | ICD-10-CM | POA: Diagnosis not present

## 2022-10-13 DIAGNOSIS — F1020 Alcohol dependence, uncomplicated: Secondary | ICD-10-CM | POA: Diagnosis not present

## 2022-10-13 DIAGNOSIS — Z7151 Drug abuse counseling and surveillance of drug abuser: Secondary | ICD-10-CM | POA: Diagnosis not present

## 2022-10-13 DIAGNOSIS — F1121 Opioid dependence, in remission: Secondary | ICD-10-CM | POA: Diagnosis not present

## 2022-10-13 DIAGNOSIS — F1420 Cocaine dependence, uncomplicated: Secondary | ICD-10-CM | POA: Diagnosis not present

## 2022-10-17 DIAGNOSIS — Z7151 Drug abuse counseling and surveillance of drug abuser: Secondary | ICD-10-CM | POA: Diagnosis not present

## 2022-10-17 DIAGNOSIS — F1121 Opioid dependence, in remission: Secondary | ICD-10-CM | POA: Diagnosis not present

## 2022-10-17 DIAGNOSIS — F1420 Cocaine dependence, uncomplicated: Secondary | ICD-10-CM | POA: Diagnosis not present

## 2022-10-17 DIAGNOSIS — F1120 Opioid dependence, uncomplicated: Secondary | ICD-10-CM | POA: Diagnosis not present

## 2022-10-17 DIAGNOSIS — F1020 Alcohol dependence, uncomplicated: Secondary | ICD-10-CM | POA: Diagnosis not present

## 2022-10-20 DIAGNOSIS — Z7151 Drug abuse counseling and surveillance of drug abuser: Secondary | ICD-10-CM | POA: Diagnosis not present

## 2022-10-20 DIAGNOSIS — F1120 Opioid dependence, uncomplicated: Secondary | ICD-10-CM | POA: Diagnosis not present

## 2022-10-24 DIAGNOSIS — F1120 Opioid dependence, uncomplicated: Secondary | ICD-10-CM | POA: Diagnosis not present

## 2022-10-24 DIAGNOSIS — Z7151 Drug abuse counseling and surveillance of drug abuser: Secondary | ICD-10-CM | POA: Diagnosis not present

## 2022-10-27 DIAGNOSIS — Z7151 Drug abuse counseling and surveillance of drug abuser: Secondary | ICD-10-CM | POA: Diagnosis not present

## 2022-10-27 DIAGNOSIS — F1120 Opioid dependence, uncomplicated: Secondary | ICD-10-CM | POA: Diagnosis not present

## 2022-10-31 DIAGNOSIS — F1290 Cannabis use, unspecified, uncomplicated: Secondary | ICD-10-CM | POA: Diagnosis not present

## 2022-10-31 DIAGNOSIS — Z7151 Drug abuse counseling and surveillance of drug abuser: Secondary | ICD-10-CM | POA: Diagnosis not present

## 2022-11-03 DIAGNOSIS — F1020 Alcohol dependence, uncomplicated: Secondary | ICD-10-CM | POA: Diagnosis not present

## 2022-11-03 DIAGNOSIS — F1120 Opioid dependence, uncomplicated: Secondary | ICD-10-CM | POA: Diagnosis not present

## 2022-11-03 DIAGNOSIS — Z7151 Drug abuse counseling and surveillance of drug abuser: Secondary | ICD-10-CM | POA: Diagnosis not present

## 2022-11-03 DIAGNOSIS — F1420 Cocaine dependence, uncomplicated: Secondary | ICD-10-CM | POA: Diagnosis not present

## 2022-11-07 DIAGNOSIS — Z7151 Drug abuse counseling and surveillance of drug abuser: Secondary | ICD-10-CM | POA: Diagnosis not present

## 2022-11-07 DIAGNOSIS — F1120 Opioid dependence, uncomplicated: Secondary | ICD-10-CM | POA: Diagnosis not present

## 2022-11-10 DIAGNOSIS — Z7151 Drug abuse counseling and surveillance of drug abuser: Secondary | ICD-10-CM | POA: Diagnosis not present

## 2022-11-10 DIAGNOSIS — F1290 Cannabis use, unspecified, uncomplicated: Secondary | ICD-10-CM | POA: Diagnosis not present

## 2022-11-14 DIAGNOSIS — Z7151 Drug abuse counseling and surveillance of drug abuser: Secondary | ICD-10-CM | POA: Diagnosis not present

## 2022-11-14 DIAGNOSIS — F1220 Cannabis dependence, uncomplicated: Secondary | ICD-10-CM | POA: Diagnosis not present

## 2022-11-17 DIAGNOSIS — Z7151 Drug abuse counseling and surveillance of drug abuser: Secondary | ICD-10-CM | POA: Diagnosis not present

## 2022-11-17 DIAGNOSIS — F1290 Cannabis use, unspecified, uncomplicated: Secondary | ICD-10-CM | POA: Diagnosis not present

## 2022-11-17 DIAGNOSIS — F1120 Opioid dependence, uncomplicated: Secondary | ICD-10-CM | POA: Diagnosis not present

## 2022-11-21 DIAGNOSIS — F1290 Cannabis use, unspecified, uncomplicated: Secondary | ICD-10-CM | POA: Diagnosis not present

## 2022-11-21 DIAGNOSIS — Z7151 Drug abuse counseling and surveillance of drug abuser: Secondary | ICD-10-CM | POA: Diagnosis not present

## 2022-11-24 DIAGNOSIS — F1290 Cannabis use, unspecified, uncomplicated: Secondary | ICD-10-CM | POA: Diagnosis not present

## 2022-11-24 DIAGNOSIS — F1121 Opioid dependence, in remission: Secondary | ICD-10-CM | POA: Diagnosis not present

## 2022-11-24 DIAGNOSIS — Z7151 Drug abuse counseling and surveillance of drug abuser: Secondary | ICD-10-CM | POA: Diagnosis not present

## 2022-11-24 DIAGNOSIS — F1120 Opioid dependence, uncomplicated: Secondary | ICD-10-CM | POA: Diagnosis not present

## 2022-11-28 DIAGNOSIS — F1120 Opioid dependence, uncomplicated: Secondary | ICD-10-CM | POA: Diagnosis not present

## 2022-11-28 DIAGNOSIS — Z7151 Drug abuse counseling and surveillance of drug abuser: Secondary | ICD-10-CM | POA: Diagnosis not present

## 2022-11-28 DIAGNOSIS — F1290 Cannabis use, unspecified, uncomplicated: Secondary | ICD-10-CM | POA: Diagnosis not present

## 2022-11-28 DIAGNOSIS — F1420 Cocaine dependence, uncomplicated: Secondary | ICD-10-CM | POA: Diagnosis not present

## 2022-12-01 DIAGNOSIS — Z7151 Drug abuse counseling and surveillance of drug abuser: Secondary | ICD-10-CM | POA: Diagnosis not present

## 2022-12-01 DIAGNOSIS — F1120 Opioid dependence, uncomplicated: Secondary | ICD-10-CM | POA: Diagnosis not present

## 2022-12-05 DIAGNOSIS — Z7151 Drug abuse counseling and surveillance of drug abuser: Secondary | ICD-10-CM | POA: Diagnosis not present

## 2022-12-05 DIAGNOSIS — F1290 Cannabis use, unspecified, uncomplicated: Secondary | ICD-10-CM | POA: Diagnosis not present

## 2022-12-05 DIAGNOSIS — F1120 Opioid dependence, uncomplicated: Secondary | ICD-10-CM | POA: Diagnosis not present

## 2022-12-08 DIAGNOSIS — Z7151 Drug abuse counseling and surveillance of drug abuser: Secondary | ICD-10-CM | POA: Diagnosis not present

## 2022-12-08 DIAGNOSIS — F1120 Opioid dependence, uncomplicated: Secondary | ICD-10-CM | POA: Diagnosis not present

## 2022-12-08 DIAGNOSIS — F1290 Cannabis use, unspecified, uncomplicated: Secondary | ICD-10-CM | POA: Diagnosis not present

## 2022-12-12 DIAGNOSIS — I10 Essential (primary) hypertension: Secondary | ICD-10-CM | POA: Diagnosis not present

## 2022-12-12 DIAGNOSIS — F1120 Opioid dependence, uncomplicated: Secondary | ICD-10-CM | POA: Diagnosis not present

## 2022-12-12 DIAGNOSIS — Z7151 Drug abuse counseling and surveillance of drug abuser: Secondary | ICD-10-CM | POA: Diagnosis not present

## 2022-12-12 DIAGNOSIS — G4733 Obstructive sleep apnea (adult) (pediatric): Secondary | ICD-10-CM | POA: Diagnosis not present

## 2022-12-12 DIAGNOSIS — F1220 Cannabis dependence, uncomplicated: Secondary | ICD-10-CM | POA: Diagnosis not present

## 2022-12-22 DIAGNOSIS — Z7151 Drug abuse counseling and surveillance of drug abuser: Secondary | ICD-10-CM | POA: Diagnosis not present

## 2022-12-22 DIAGNOSIS — F1120 Opioid dependence, uncomplicated: Secondary | ICD-10-CM | POA: Diagnosis not present

## 2022-12-22 DIAGNOSIS — F1420 Cocaine dependence, uncomplicated: Secondary | ICD-10-CM | POA: Diagnosis not present

## 2022-12-26 DIAGNOSIS — F1290 Cannabis use, unspecified, uncomplicated: Secondary | ICD-10-CM | POA: Diagnosis not present

## 2022-12-26 DIAGNOSIS — F1121 Opioid dependence, in remission: Secondary | ICD-10-CM | POA: Diagnosis not present

## 2022-12-26 DIAGNOSIS — Z7151 Drug abuse counseling and surveillance of drug abuser: Secondary | ICD-10-CM | POA: Diagnosis not present

## 2022-12-29 DIAGNOSIS — F1120 Opioid dependence, uncomplicated: Secondary | ICD-10-CM | POA: Diagnosis not present

## 2022-12-29 DIAGNOSIS — Z7151 Drug abuse counseling and surveillance of drug abuser: Secondary | ICD-10-CM | POA: Diagnosis not present

## 2022-12-29 DIAGNOSIS — F1290 Cannabis use, unspecified, uncomplicated: Secondary | ICD-10-CM | POA: Diagnosis not present

## 2023-01-02 DIAGNOSIS — Z7151 Drug abuse counseling and surveillance of drug abuser: Secondary | ICD-10-CM | POA: Diagnosis not present

## 2023-01-02 DIAGNOSIS — F1290 Cannabis use, unspecified, uncomplicated: Secondary | ICD-10-CM | POA: Diagnosis not present

## 2023-01-02 DIAGNOSIS — F1120 Opioid dependence, uncomplicated: Secondary | ICD-10-CM | POA: Diagnosis not present

## 2023-01-05 DIAGNOSIS — F1120 Opioid dependence, uncomplicated: Secondary | ICD-10-CM | POA: Diagnosis not present

## 2023-01-05 DIAGNOSIS — F1420 Cocaine dependence, uncomplicated: Secondary | ICD-10-CM | POA: Diagnosis not present

## 2023-01-09 DIAGNOSIS — F1290 Cannabis use, unspecified, uncomplicated: Secondary | ICD-10-CM | POA: Diagnosis not present

## 2023-01-09 DIAGNOSIS — F1121 Opioid dependence, in remission: Secondary | ICD-10-CM | POA: Diagnosis not present

## 2023-01-09 DIAGNOSIS — Z7151 Drug abuse counseling and surveillance of drug abuser: Secondary | ICD-10-CM | POA: Diagnosis not present

## 2023-01-12 DIAGNOSIS — F1120 Opioid dependence, uncomplicated: Secondary | ICD-10-CM | POA: Diagnosis not present

## 2023-01-12 DIAGNOSIS — Z7151 Drug abuse counseling and surveillance of drug abuser: Secondary | ICD-10-CM | POA: Diagnosis not present

## 2023-01-12 DIAGNOSIS — F1290 Cannabis use, unspecified, uncomplicated: Secondary | ICD-10-CM | POA: Diagnosis not present

## 2023-01-19 DIAGNOSIS — F1290 Cannabis use, unspecified, uncomplicated: Secondary | ICD-10-CM | POA: Diagnosis not present

## 2023-01-19 DIAGNOSIS — F1120 Opioid dependence, uncomplicated: Secondary | ICD-10-CM | POA: Diagnosis not present

## 2023-01-19 DIAGNOSIS — Z7151 Drug abuse counseling and surveillance of drug abuser: Secondary | ICD-10-CM | POA: Diagnosis not present

## 2023-01-23 DIAGNOSIS — F1120 Opioid dependence, uncomplicated: Secondary | ICD-10-CM | POA: Diagnosis not present

## 2023-01-23 DIAGNOSIS — Z7151 Drug abuse counseling and surveillance of drug abuser: Secondary | ICD-10-CM | POA: Diagnosis not present

## 2023-01-26 DIAGNOSIS — Z7151 Drug abuse counseling and surveillance of drug abuser: Secondary | ICD-10-CM | POA: Diagnosis not present

## 2023-01-26 DIAGNOSIS — F1120 Opioid dependence, uncomplicated: Secondary | ICD-10-CM | POA: Diagnosis not present

## 2023-02-01 DIAGNOSIS — F1120 Opioid dependence, uncomplicated: Secondary | ICD-10-CM | POA: Diagnosis not present

## 2023-02-01 DIAGNOSIS — F1220 Cannabis dependence, uncomplicated: Secondary | ICD-10-CM | POA: Diagnosis not present

## 2023-02-01 DIAGNOSIS — Z7151 Drug abuse counseling and surveillance of drug abuser: Secondary | ICD-10-CM | POA: Diagnosis not present

## 2023-02-02 DIAGNOSIS — Z7151 Drug abuse counseling and surveillance of drug abuser: Secondary | ICD-10-CM | POA: Diagnosis not present

## 2023-02-02 DIAGNOSIS — F1121 Opioid dependence, in remission: Secondary | ICD-10-CM | POA: Diagnosis not present

## 2023-02-02 DIAGNOSIS — F1291 Cannabis use, unspecified, in remission: Secondary | ICD-10-CM | POA: Diagnosis not present

## 2023-02-03 DIAGNOSIS — R635 Abnormal weight gain: Secondary | ICD-10-CM | POA: Diagnosis not present

## 2023-02-03 DIAGNOSIS — R7301 Impaired fasting glucose: Secondary | ICD-10-CM | POA: Diagnosis not present

## 2023-02-06 DIAGNOSIS — F1120 Opioid dependence, uncomplicated: Secondary | ICD-10-CM | POA: Diagnosis not present

## 2023-02-06 DIAGNOSIS — Z7151 Drug abuse counseling and surveillance of drug abuser: Secondary | ICD-10-CM | POA: Diagnosis not present

## 2023-02-09 DIAGNOSIS — F1290 Cannabis use, unspecified, uncomplicated: Secondary | ICD-10-CM | POA: Diagnosis not present

## 2023-02-09 DIAGNOSIS — Z7151 Drug abuse counseling and surveillance of drug abuser: Secondary | ICD-10-CM | POA: Diagnosis not present

## 2023-02-09 DIAGNOSIS — F1121 Opioid dependence, in remission: Secondary | ICD-10-CM | POA: Diagnosis not present

## 2023-02-13 DIAGNOSIS — F1290 Cannabis use, unspecified, uncomplicated: Secondary | ICD-10-CM | POA: Diagnosis not present

## 2023-02-13 DIAGNOSIS — F1121 Opioid dependence, in remission: Secondary | ICD-10-CM | POA: Diagnosis not present

## 2023-02-13 DIAGNOSIS — Z7151 Drug abuse counseling and surveillance of drug abuser: Secondary | ICD-10-CM | POA: Diagnosis not present

## 2023-02-23 DIAGNOSIS — F1020 Alcohol dependence, uncomplicated: Secondary | ICD-10-CM | POA: Diagnosis not present

## 2023-02-23 DIAGNOSIS — F1121 Opioid dependence, in remission: Secondary | ICD-10-CM | POA: Diagnosis not present

## 2023-02-23 DIAGNOSIS — Z7151 Drug abuse counseling and surveillance of drug abuser: Secondary | ICD-10-CM | POA: Diagnosis not present

## 2023-02-27 DIAGNOSIS — F1020 Alcohol dependence, uncomplicated: Secondary | ICD-10-CM | POA: Diagnosis not present

## 2023-02-27 DIAGNOSIS — F1290 Cannabis use, unspecified, uncomplicated: Secondary | ICD-10-CM | POA: Diagnosis not present

## 2023-02-27 DIAGNOSIS — Z7151 Drug abuse counseling and surveillance of drug abuser: Secondary | ICD-10-CM | POA: Diagnosis not present

## 2023-03-02 DIAGNOSIS — F1290 Cannabis use, unspecified, uncomplicated: Secondary | ICD-10-CM | POA: Diagnosis not present

## 2023-03-02 DIAGNOSIS — Z7151 Drug abuse counseling and surveillance of drug abuser: Secondary | ICD-10-CM | POA: Diagnosis not present

## 2023-03-02 DIAGNOSIS — F1120 Opioid dependence, uncomplicated: Secondary | ICD-10-CM | POA: Diagnosis not present

## 2023-03-06 DIAGNOSIS — Z7151 Drug abuse counseling and surveillance of drug abuser: Secondary | ICD-10-CM | POA: Diagnosis not present

## 2023-03-06 DIAGNOSIS — F1020 Alcohol dependence, uncomplicated: Secondary | ICD-10-CM | POA: Diagnosis not present

## 2023-03-06 DIAGNOSIS — F1290 Cannabis use, unspecified, uncomplicated: Secondary | ICD-10-CM | POA: Diagnosis not present

## 2023-03-09 DIAGNOSIS — F1020 Alcohol dependence, uncomplicated: Secondary | ICD-10-CM | POA: Diagnosis not present

## 2023-03-09 DIAGNOSIS — F1290 Cannabis use, unspecified, uncomplicated: Secondary | ICD-10-CM | POA: Diagnosis not present

## 2023-03-20 DIAGNOSIS — Z7151 Drug abuse counseling and surveillance of drug abuser: Secondary | ICD-10-CM | POA: Diagnosis not present

## 2023-03-20 DIAGNOSIS — F1290 Cannabis use, unspecified, uncomplicated: Secondary | ICD-10-CM | POA: Diagnosis not present

## 2023-03-20 DIAGNOSIS — F1121 Opioid dependence, in remission: Secondary | ICD-10-CM | POA: Diagnosis not present

## 2023-03-27 DIAGNOSIS — F1020 Alcohol dependence, uncomplicated: Secondary | ICD-10-CM | POA: Diagnosis not present

## 2023-03-27 DIAGNOSIS — F1290 Cannabis use, unspecified, uncomplicated: Secondary | ICD-10-CM | POA: Diagnosis not present

## 2023-03-27 DIAGNOSIS — Z7151 Drug abuse counseling and surveillance of drug abuser: Secondary | ICD-10-CM | POA: Diagnosis not present

## 2023-03-30 DIAGNOSIS — F1120 Opioid dependence, uncomplicated: Secondary | ICD-10-CM | POA: Diagnosis not present

## 2023-03-30 DIAGNOSIS — Z7151 Drug abuse counseling and surveillance of drug abuser: Secondary | ICD-10-CM | POA: Diagnosis not present

## 2023-04-10 DIAGNOSIS — F1020 Alcohol dependence, uncomplicated: Secondary | ICD-10-CM | POA: Diagnosis not present

## 2023-04-10 DIAGNOSIS — Z7151 Drug abuse counseling and surveillance of drug abuser: Secondary | ICD-10-CM | POA: Diagnosis not present

## 2023-04-10 DIAGNOSIS — F1290 Cannabis use, unspecified, uncomplicated: Secondary | ICD-10-CM | POA: Diagnosis not present

## 2023-04-13 DIAGNOSIS — Z7151 Drug abuse counseling and surveillance of drug abuser: Secondary | ICD-10-CM | POA: Diagnosis not present

## 2023-04-13 DIAGNOSIS — F1120 Opioid dependence, uncomplicated: Secondary | ICD-10-CM | POA: Diagnosis not present

## 2023-04-24 DIAGNOSIS — F1020 Alcohol dependence, uncomplicated: Secondary | ICD-10-CM | POA: Diagnosis not present

## 2023-04-24 DIAGNOSIS — F1290 Cannabis use, unspecified, uncomplicated: Secondary | ICD-10-CM | POA: Diagnosis not present

## 2023-04-24 DIAGNOSIS — Z7151 Drug abuse counseling and surveillance of drug abuser: Secondary | ICD-10-CM | POA: Diagnosis not present

## 2023-05-04 DIAGNOSIS — F1020 Alcohol dependence, uncomplicated: Secondary | ICD-10-CM | POA: Diagnosis not present

## 2023-05-04 DIAGNOSIS — F1121 Opioid dependence, in remission: Secondary | ICD-10-CM | POA: Diagnosis not present

## 2023-05-04 DIAGNOSIS — Z7151 Drug abuse counseling and surveillance of drug abuser: Secondary | ICD-10-CM | POA: Diagnosis not present

## 2023-05-25 DIAGNOSIS — Z7151 Drug abuse counseling and surveillance of drug abuser: Secondary | ICD-10-CM | POA: Diagnosis not present

## 2023-05-25 DIAGNOSIS — F1120 Opioid dependence, uncomplicated: Secondary | ICD-10-CM | POA: Diagnosis not present

## 2023-05-25 DIAGNOSIS — F1290 Cannabis use, unspecified, uncomplicated: Secondary | ICD-10-CM | POA: Diagnosis not present

## 2023-05-31 DIAGNOSIS — Z125 Encounter for screening for malignant neoplasm of prostate: Secondary | ICD-10-CM | POA: Diagnosis not present

## 2023-05-31 DIAGNOSIS — R7301 Impaired fasting glucose: Secondary | ICD-10-CM | POA: Diagnosis not present

## 2023-05-31 DIAGNOSIS — Z0000 Encounter for general adult medical examination without abnormal findings: Secondary | ICD-10-CM | POA: Diagnosis not present

## 2023-06-07 DIAGNOSIS — Z1339 Encounter for screening examination for other mental health and behavioral disorders: Secondary | ICD-10-CM | POA: Diagnosis not present

## 2023-06-07 DIAGNOSIS — L309 Dermatitis, unspecified: Secondary | ICD-10-CM | POA: Diagnosis not present

## 2023-06-07 DIAGNOSIS — Z1331 Encounter for screening for depression: Secondary | ICD-10-CM | POA: Diagnosis not present

## 2023-06-07 DIAGNOSIS — Z0000 Encounter for general adult medical examination without abnormal findings: Secondary | ICD-10-CM | POA: Diagnosis not present

## 2023-06-08 DIAGNOSIS — F1120 Opioid dependence, uncomplicated: Secondary | ICD-10-CM | POA: Diagnosis not present

## 2023-06-08 DIAGNOSIS — Z7151 Drug abuse counseling and surveillance of drug abuser: Secondary | ICD-10-CM | POA: Diagnosis not present

## 2023-06-22 DIAGNOSIS — Z7151 Drug abuse counseling and surveillance of drug abuser: Secondary | ICD-10-CM | POA: Diagnosis not present

## 2023-06-22 DIAGNOSIS — F1020 Alcohol dependence, uncomplicated: Secondary | ICD-10-CM | POA: Diagnosis not present

## 2023-08-17 DIAGNOSIS — Z7151 Drug abuse counseling and surveillance of drug abuser: Secondary | ICD-10-CM | POA: Diagnosis not present

## 2023-08-17 DIAGNOSIS — F1420 Cocaine dependence, uncomplicated: Secondary | ICD-10-CM | POA: Diagnosis not present

## 2023-08-31 DIAGNOSIS — F1020 Alcohol dependence, uncomplicated: Secondary | ICD-10-CM | POA: Diagnosis not present

## 2023-08-31 DIAGNOSIS — F1120 Opioid dependence, uncomplicated: Secondary | ICD-10-CM | POA: Diagnosis not present

## 2023-10-12 DIAGNOSIS — Z7151 Drug abuse counseling and surveillance of drug abuser: Secondary | ICD-10-CM | POA: Diagnosis not present

## 2023-10-12 DIAGNOSIS — F1020 Alcohol dependence, uncomplicated: Secondary | ICD-10-CM | POA: Diagnosis not present

## 2023-11-14 DIAGNOSIS — I10 Essential (primary) hypertension: Secondary | ICD-10-CM | POA: Diagnosis not present

## 2023-11-14 DIAGNOSIS — G4733 Obstructive sleep apnea (adult) (pediatric): Secondary | ICD-10-CM | POA: Diagnosis not present

## 2023-12-07 DIAGNOSIS — Z7141 Alcohol abuse counseling and surveillance of alcoholic: Secondary | ICD-10-CM | POA: Diagnosis not present

## 2023-12-07 DIAGNOSIS — F1020 Alcohol dependence, uncomplicated: Secondary | ICD-10-CM | POA: Diagnosis not present

## 2023-12-07 DIAGNOSIS — F1120 Opioid dependence, uncomplicated: Secondary | ICD-10-CM | POA: Diagnosis not present

## 2024-01-04 DIAGNOSIS — Z7151 Drug abuse counseling and surveillance of drug abuser: Secondary | ICD-10-CM | POA: Diagnosis not present

## 2024-01-04 DIAGNOSIS — F1120 Opioid dependence, uncomplicated: Secondary | ICD-10-CM | POA: Diagnosis not present

## 2024-02-29 DIAGNOSIS — Z7151 Drug abuse counseling and surveillance of drug abuser: Secondary | ICD-10-CM | POA: Diagnosis not present

## 2024-02-29 DIAGNOSIS — F1120 Opioid dependence, uncomplicated: Secondary | ICD-10-CM | POA: Diagnosis not present
